# Patient Record
Sex: Female | Born: 1970 | Race: Black or African American | Hispanic: No | Smoking: Never smoker
Health system: Southern US, Community
[De-identification: ages and names within clinical notes are randomized; demographics above are authoritative.]

## PROBLEM LIST (undated history)

## (undated) DIAGNOSIS — A630 Anogenital (venereal) warts: Secondary | ICD-10-CM

## (undated) DIAGNOSIS — A6000 Herpesviral infection of urogenital system, unspecified: Secondary | ICD-10-CM

## (undated) DIAGNOSIS — R87619 Unspecified abnormal cytological findings in specimens from cervix uteri: Secondary | ICD-10-CM

## (undated) DIAGNOSIS — T7840XA Allergy, unspecified, initial encounter: Secondary | ICD-10-CM

## (undated) DIAGNOSIS — J45909 Unspecified asthma, uncomplicated: Secondary | ICD-10-CM

## (undated) DIAGNOSIS — E559 Vitamin D deficiency, unspecified: Secondary | ICD-10-CM

## (undated) DIAGNOSIS — Z803 Family history of malignant neoplasm of breast: Secondary | ICD-10-CM

## (undated) DIAGNOSIS — R319 Hematuria, unspecified: Secondary | ICD-10-CM

## (undated) DIAGNOSIS — Z8041 Family history of malignant neoplasm of ovary: Secondary | ICD-10-CM

## (undated) DIAGNOSIS — G5600 Carpal tunnel syndrome, unspecified upper limb: Secondary | ICD-10-CM

## (undated) DIAGNOSIS — G9050 Complex regional pain syndrome I, unspecified: Secondary | ICD-10-CM

## (undated) DIAGNOSIS — Z8051 Family history of malignant neoplasm of kidney: Secondary | ICD-10-CM

## (undated) HISTORY — DX: Herpesviral infection of urogenital system, unspecified: A60.00

## (undated) HISTORY — DX: Unspecified abnormal cytological findings in specimens from cervix uteri: R87.619

## (undated) HISTORY — DX: Family history of malignant neoplasm of breast: Z80.3

## (undated) HISTORY — DX: Allergy, unspecified, initial encounter: T78.40XA

## (undated) HISTORY — DX: Hematuria, unspecified: R31.9

## (undated) HISTORY — DX: Family history of malignant neoplasm of kidney: Z80.51

## (undated) HISTORY — DX: Unspecified asthma, uncomplicated: J45.909

## (undated) HISTORY — DX: Anogenital (venereal) warts: A63.0

## (undated) HISTORY — DX: Vitamin D deficiency, unspecified: E55.9

## (undated) HISTORY — DX: Family history of malignant neoplasm of ovary: Z80.41

## (undated) HISTORY — DX: Carpal tunnel syndrome, unspecified upper limb: G56.00

## (undated) HISTORY — DX: Complex regional pain syndrome I, unspecified: G90.50

---

## 2008-08-02 ENCOUNTER — Emergency Department: Payer: Self-pay | Admitting: Emergency Medicine

## 2008-08-19 DIAGNOSIS — R87619 Unspecified abnormal cytological findings in specimens from cervix uteri: Secondary | ICD-10-CM

## 2008-08-19 HISTORY — DX: Unspecified abnormal cytological findings in specimens from cervix uteri: R87.619

## 2008-08-19 HISTORY — PX: CERVICAL BIOPSY  W/ LOOP ELECTRODE EXCISION: SUR135

## 2009-04-20 ENCOUNTER — Ambulatory Visit: Payer: Self-pay

## 2009-04-27 ENCOUNTER — Ambulatory Visit: Payer: Self-pay

## 2010-03-05 ENCOUNTER — Other Ambulatory Visit: Payer: Self-pay

## 2010-12-06 ENCOUNTER — Ambulatory Visit: Payer: Self-pay

## 2011-07-18 ENCOUNTER — Ambulatory Visit: Payer: Self-pay

## 2013-02-24 ENCOUNTER — Ambulatory Visit: Payer: Self-pay | Admitting: Surgery

## 2013-10-01 ENCOUNTER — Ambulatory Visit: Payer: Self-pay | Admitting: Surgery

## 2014-11-09 ENCOUNTER — Ambulatory Visit: Payer: Self-pay | Admitting: Podiatry

## 2014-11-10 ENCOUNTER — Ambulatory Visit (INDEPENDENT_AMBULATORY_CARE_PROVIDER_SITE_OTHER): Payer: BLUE CROSS/BLUE SHIELD | Admitting: Podiatry

## 2014-11-10 ENCOUNTER — Ambulatory Visit (INDEPENDENT_AMBULATORY_CARE_PROVIDER_SITE_OTHER): Payer: BLUE CROSS/BLUE SHIELD

## 2014-11-10 ENCOUNTER — Encounter: Payer: Self-pay | Admitting: Podiatry

## 2014-11-10 VITALS — BP 129/89 | HR 88 | Resp 16 | Ht 65.0 in | Wt 122.0 lb

## 2014-11-10 DIAGNOSIS — M62838 Other muscle spasm: Secondary | ICD-10-CM

## 2014-11-10 DIAGNOSIS — M79672 Pain in left foot: Secondary | ICD-10-CM

## 2014-11-10 DIAGNOSIS — M2142 Flat foot [pes planus] (acquired), left foot: Secondary | ICD-10-CM

## 2014-11-10 DIAGNOSIS — M79671 Pain in right foot: Secondary | ICD-10-CM

## 2014-11-10 DIAGNOSIS — M2141 Flat foot [pes planus] (acquired), right foot: Secondary | ICD-10-CM

## 2014-11-10 NOTE — Progress Notes (Signed)
   Subjective:    Patient ID: Stacey Morris, female    DOB: 08-Oct-1970, 44 y.o.   MRN: [id number]  HPI 44 year old female presents the office today with complaints of bilateral leg numbness, toe curling and muscle spasms. She states that the symptoms are intermittent in nature and not continuous. She cannot identify and inside the incident which aggravates the symptoms. She describes though it her leg cramps her feet go up and out. She denies any history of injury or trauma. She intermittently has numbness to her feet mostly on the bottom at that time cramping. Currently she does not have any the symptoms. States the symptoms have been going on for greater than one year and have not be progressive. Denies any swelling or redness overlying the area. No other complaints at this time.    Review of Systems  Allergic/Immunologic: Positive for environmental allergies.  All other systems reviewed and are negative.      Objective:   Physical Exam AAO 3, NAD DP/PT pulses palpable, CRT less than 3 seconds Protective sensation intact with Simms Weinstein monofilament, vibratory sensation intact, Achilles tendon reflex intact. There is a decrease in medial arch height upon weightbearing with equinus present. There is no areas of pinpoint bony tenderness or pain the vibratory sensation of bilateral/ankle. There is no overlying edema, erythema, increase in warmth bilaterally. Ankle joint subtotal joint range of motion is intact and without restriction. MMT 5/5, ROM WNL No open lesions or pre-ulcerative lesions identified bilaterally. No pain with calf compression, swelling, warmth, erythema.      Assessment & Plan:  44 year old female with flatfoot deformity, possible peroneal spasm -X-rays were obtained and reviewed. -Treatment options were discussed included alternatives, risks, complications. -Discussed likely etiology of her symptoms. -At this time I believe she could benefit from orthotics to help  support her foot type to help prevent spasm. It appears that that her peroneals are spasming due to flatfoot. -If symptoms continue likely referred to neurology. -Follow-up in 4 weeks or sooner if any problems are to arise. In the meantime occurs to call the office with any questions, concerns, change in symptoms.

## 2015-01-30 DIAGNOSIS — M545 Low back pain, unspecified: Secondary | ICD-10-CM | POA: Insufficient documentation

## 2015-01-30 DIAGNOSIS — G8929 Other chronic pain: Secondary | ICD-10-CM | POA: Insufficient documentation

## 2015-08-30 ENCOUNTER — Telehealth: Payer: Self-pay | Admitting: *Deleted

## 2015-08-30 NOTE — Telephone Encounter (Signed)
Pt states she is having problems with the bottom of her feet again and wanted a prescription.  I reviewed pt's clinical record and she has not been seen in our office for over 9 months.  I left a message on the phone number left by the pt although there did not seem to be an introduction, but a silent spot for a message.

## 2017-10-23 ENCOUNTER — Ambulatory Visit (INDEPENDENT_AMBULATORY_CARE_PROVIDER_SITE_OTHER): Payer: BLUE CROSS/BLUE SHIELD | Admitting: Certified Nurse Midwife

## 2017-10-23 ENCOUNTER — Encounter: Payer: Self-pay | Admitting: Certified Nurse Midwife

## 2017-10-23 VITALS — BP 130/80 | HR 85 | Ht 65.0 in | Wt 126.0 lb

## 2017-10-23 DIAGNOSIS — Z01419 Encounter for gynecological examination (general) (routine) without abnormal findings: Secondary | ICD-10-CM

## 2017-10-23 DIAGNOSIS — Z1211 Encounter for screening for malignant neoplasm of colon: Secondary | ICD-10-CM

## 2017-10-23 DIAGNOSIS — A6000 Herpesviral infection of urogenital system, unspecified: Secondary | ICD-10-CM | POA: Insufficient documentation

## 2017-10-23 DIAGNOSIS — Z1231 Encounter for screening mammogram for malignant neoplasm of breast: Secondary | ICD-10-CM

## 2017-10-23 DIAGNOSIS — Z1239 Encounter for other screening for malignant neoplasm of breast: Secondary | ICD-10-CM

## 2017-10-23 DIAGNOSIS — T7840XA Allergy, unspecified, initial encounter: Secondary | ICD-10-CM | POA: Insufficient documentation

## 2017-10-23 DIAGNOSIS — Z124 Encounter for screening for malignant neoplasm of cervix: Secondary | ICD-10-CM | POA: Diagnosis not present

## 2017-10-23 DIAGNOSIS — Z3041 Encounter for surveillance of contraceptive pills: Secondary | ICD-10-CM

## 2017-10-23 DIAGNOSIS — J45909 Unspecified asthma, uncomplicated: Secondary | ICD-10-CM | POA: Insufficient documentation

## 2017-10-23 MED ORDER — VALACYCLOVIR HCL 1 G PO TABS: ORAL_TABLET | ORAL | 3 refills | Status: DC

## 2017-10-23 MED ORDER — TRI-SPRINTEC 0.18/0.215/0.25 MG-35 MCG PO TABS: 1.0000 | ORAL_TABLET | Freq: Every day | ORAL | 3 refills | Status: DC

## 2017-10-23 NOTE — Progress Notes (Signed)
valtrex     Gynecology Annual Exam  PCP: Patient, No Pcp Per  Chief Complaint:  Chief Complaint  Patient presents with  . Gynecologic Exam    History of Present Illness:Stacey Morris is a 47 year old African American/Black female , G 4 P 3 0 1 3 , who presents for her annual exam . She is having no significant GYN problems. She just moved back from [facility]. Her menses are regular and her LMP was 09/17/2017. They occur every 1 month, they last 4 days, are medium flow (occasionally heavier flow requiring pad and tampon change 4-5x/day)., and are without clots.  She has had no intermenstrual spotting.  She reports worsening  dysmenorrhea requiring Aleve +/- heating pad  The patient's past medical history is remarkable for allergies and asthma. She has a history of HSV, but has had only 2 outbreaks since being diagnosed in 2012.  She had her last annual and Pap smear in 2017 in [place].  She is sexually active. She is currently using birth control pills for contraception. Her current partner is HSV IGG negative. Have been using condoms also. Is interested in antiviral PPX to reduce risk of transmission. Her most recent pap smear was obtained 2017 and was negative per patient report. Her most recent mammogram obtained 2017 and was stable. She had been referred to DR Pat Patrick for evaluation of small right breast masses 2014 by Dr Laurey Morale.  There is a positive history of breast cancer in her paternal aunt. Genetic testing has not been done.  There is no family history of ovarian cancer.  The patient does do monthly self breast exams.  The patient does not smoke.  The patient does occasionally drink alcohol.  The patient does not use illegal drugs.  The patient exercises occasionally.  The patient does get adequate calcium in her diet.  She has had a recent cholesterol screen in 2017 and it was normal per patient report.   The patient denies current symptoms of depression.    Review  of Systems: Review of Systems  Constitutional: Positive for malaise/fatigue. Negative for chills, fever and weight loss.  HENT: Positive for congestion and sore throat. Negative for sinus pain.        Positive for sneezing  Eyes: Positive for redness. Negative for blurred vision and pain.  Respiratory: Positive for cough, shortness of breath and wheezing. Negative for hemoptysis.   Cardiovascular: Negative for chest pain, palpitations and leg swelling.       Difficulty breathing when lying down R./T allergies  Gastrointestinal: Negative for abdominal pain, blood in stool, diarrhea, heartburn, nausea and vomiting.  Genitourinary: Negative for dysuria, frequency, hematuria and urgency.  Musculoskeletal: Negative for back pain, joint pain and myalgias.  Skin: Negative for itching and rash.  Neurological: Positive for tingling (feet (plantar fasciitis)) and headaches. Negative for dizziness.  Endo/Heme/Allergies: Positive for environmental allergies. Negative for polydipsia. Does not bruise/bleed easily.       Negative for hirsutism; positive for occasional hot flashes   Psychiatric/Behavioral: Negative for depression. The patient is not nervous/anxious and does not have insomnia.     Past Medical History:  Past Medical History:  Diagnosis Date  . Allergy   . Asthma   . Breast mass 2017   monitor with mammograms  . Genital herpes   . Genital warts     Past Surgical History:  Past Surgical History:  Procedure Laterality Date  . CERVICAL BIOPSY  W/ LOOP ELECTRODE EXCISION  2010  LGSIL benign pathology    Family History:  Family History  Problem Relation Age of Onset  . Liver cancer Mother 97  . Diabetes Father   . Breast cancer Paternal Aunt 19    Social History:  Social History   Socioeconomic History  . Marital status: Married    Spouse name: Not on file  . Number of children: Not on file  . Years of education: Not on file  . Highest education level: Not on file    Social Needs  . Financial resource strain: Not on file  . Food insecurity - worry: Not on file  . Food insecurity - inability: Not on file  . Transportation needs - medical: Not on file  . Transportation needs - non-medical: Not on file  Occupational History  . Not on file  Tobacco Use  . Smoking status: Never Smoker  . Smokeless tobacco: Never Used  Substance and Sexual Activity  . Alcohol use: Yes    Alcohol/week: 0.0 oz    Comment: occasional  . Drug use: No  . Sexual activity: Yes    Birth control/protection: Pill  Other Topics Concern  . Not on file  Social History Narrative  . Not on file    Allergies:  Allergies  Allergen Reactions  . Prednisone Nausea And Vomiting    Medications: Prior to Admission medications   Medication Sig Start Date End Date Taking? Authorizing Provider  Levonorgestrel-Ethinyl Estradiol (AMETHIA,CAMRESE) 0.15-0.03 &0.01 MG tablet Take 1 tablet by mouth daily.    [provider]    Physical Exam Vitals: BP 130/80   Pulse 85   Ht 5\' 5"  (1.651 m)   Wt 126 lb (57.2 kg)   LMP 09/17/2017 (Exact Date)   BMI 20.97 kg/m  General: BF in NAD HEENT: normocephalic, anicteric Neck: no thyroid enlargement, no palpable nodules, no cervical lymphadenopathy  Pulmonary: No increased work of breathing, wheezing in LLL. CTA in right lung. Cardiovascular: RRR, without murmur  Breast: Breast symmetrical, no tenderness, no palpable nodules or masses, no skin or nipple retraction present, no nipple discharge.  No axillary, infraclavicular or supraclavicular lymphadenopathy. Abdomen: Soft, non-tender, non-distended.  Umbilicus without lesions.  No hepatomegaly or masses palpable. No evidence of hernia. Genitourinary:  External: Normal external female genitalia.  Normal urethral meatus, normal Bartholin's and Skene's glands.    Vagina: Normal vaginal mucosa, no evidence of prolapse.    Cervix: Grossly normal in appearance, anterior, no bleeding,  non-tender  Uterus: Retroflexed, normal size, shape, and consistency, mobile, and non-tender  Adnexa: No adnexal masses, non-tender  Rectal: deferred  Lymphatic: no evidence of inguinal lymphadenopathy Extremities: no edema, erythema, or tenderness Neurologic: Grossly intact Psychiatric: mood appropriate, affect full     Assessment: 47 y.o. annual gyn exam Hx of HSV II  Plan:   1) Breast cancer screening - recommend monthly self breast exam and annual screening mammograms. Mammogram was ordered today.  2) Valtrex 1 GM- take 0.5 tab daily   3) Cervical cancer screening - Pap was done. ASCCP guidelines and rational discussed.  Patient opts for yearly screening interval  4) Contraception - Refills of Trisprintec sent to pharmacy #3packs with 3refills  5) Routine healthcare maintenance including cholesterol and diabetes screening UTD   6) Colon cancer screening-options discussed-desires FOBT home collection  7) RTO 1 year and prn  Dalia Heading, CNM

## 2017-10-24 ENCOUNTER — Telehealth: Payer: Self-pay | Admitting: Certified Nurse Midwife

## 2017-10-24 NOTE — Telephone Encounter (Signed)
Pt called to advise it was ok to send pap through as she was able to verify [place] will cover it

## 2017-10-24 NOTE — Telephone Encounter (Signed)
Pap processed for [facility]

## 2017-10-30 LAB — IGP, APTIMA HPV
HPV APTIMA: NEGATIVE
PAP Smear Comment: 0

## 2018-01-08 ENCOUNTER — Encounter: Payer: Self-pay | Admitting: Obstetrics and Gynecology

## 2018-01-08 ENCOUNTER — Ambulatory Visit (INDEPENDENT_AMBULATORY_CARE_PROVIDER_SITE_OTHER): Payer: BLUE CROSS/BLUE SHIELD

## 2018-01-08 ENCOUNTER — Ambulatory Visit
Admission: RE | Admit: 2018-01-08 | Discharge: 2018-01-08 | Disposition: A | Discharge status: Home / Self Care | Payer: BLUE CROSS/BLUE SHIELD | Source: Ambulatory Visit | Attending: Certified Nurse Midwife | Admitting: Certified Nurse Midwife

## 2018-01-08 ENCOUNTER — Ambulatory Visit: Payer: BLUE CROSS/BLUE SHIELD | Admitting: Obstetrics and Gynecology

## 2018-01-08 VITALS — BP 120/80 | Ht 65.0 in | Wt 125.0 lb

## 2018-01-08 DIAGNOSIS — R102 Pelvic and perineal pain: Secondary | ICD-10-CM

## 2018-01-08 DIAGNOSIS — N3000 Acute cystitis without hematuria: Secondary | ICD-10-CM

## 2018-01-08 DIAGNOSIS — Z1239 Encounter for other screening for malignant neoplasm of breast: Secondary | ICD-10-CM

## 2018-01-08 DIAGNOSIS — Z1231 Encounter for screening mammogram for malignant neoplasm of breast: Secondary | ICD-10-CM | POA: Diagnosis present

## 2018-01-08 LAB — POCT URINALYSIS DIPSTICK
Bilirubin, UA: NEGATIVE
Glucose, UA: NEGATIVE
Nitrite, UA: NEGATIVE
PH UA: 8 (ref 5.0–8.0)
Protein, UA: POSITIVE — AB
RBC UA: NEGATIVE
Spec Grav, UA: 1.01 (ref 1.010–1.025)

## 2018-01-08 MED ORDER — CIPROFLOXACIN 500 MG/5ML (10%) PO SUSR: 500.0000 mg | Freq: Two times a day (BID) | ORAL | 0 refills | Status: AC

## 2018-01-08 NOTE — Progress Notes (Signed)
Patient, No Pcp Per   Chief Complaint  Patient presents with  . Abdominal Pain    x 1 week   . Back Pain    HPI:      [name] is a 47 y.o. [phone] who LMP was Patient's last menstrual period was 11/25/2017., presents today for abd and low back pain for the past 1 1/2 wks. Sx started randomly. Abd pain is constant and achy with bloating. Intensity is mild to moderate. Aleve/lying down can help a little bit. No relation to BM. No aggrav factors. No GI, urin, vag sx. Has urin frequency with good flow. No fevers. Has had mid LBP that feels like UTI yrs ago. Pt is sex active, no new partners. No vag bleeding. On OCPs. Had annual 3/19 with neg pap.    Past Medical History:  Diagnosis Date  . Abnormal Pap smear of cervix 2010   LGSIL with negative path on LEEP  . Allergy   . Asthma   . Genital herpes   . Genital warts     Past Surgical History:  Procedure Laterality Date  . CERVICAL BIOPSY  W/ LOOP ELECTRODE EXCISION  2010   LGSIL benign pathology    Family History  Problem Relation Age of Onset  . Liver cancer Mother 41  . Diabetes Father   . Breast cancer Paternal Aunt 61  . Sarcoidosis Sister     Social History   Socioeconomic History  . Marital status: Divorced    Spouse name: Not on file  . Number of children: 3  . Years of education: Not on file  . Highest education level: Not on file  Occupational History  . Occupation: Glass blower/designer  Social Needs  . Financial resource strain: Not on file  . Food insecurity:    Worry: Not on file    Inability: Not on file  . Transportation needs:    Medical: Not on file    Non-medical: Not on file  Tobacco Use  . Smoking status: Never Smoker  . Smokeless tobacco: Never Used  Substance and Sexual Activity  . Alcohol use: Yes    Alcohol/week: 0.0 oz    Comment: occasional  . Drug use: No  . Sexual activity: Yes    Partners: Male    Birth control/protection: Pill  Lifestyle  . Physical activity:   Days per week: 0 days    Minutes per session: 0 min  . Stress: Rather much  Relationships  . Social connections:    Talks on phone: Twice a week    Gets together: Once a week    Attends religious service: Never    Active member of club or organization: No    Attends meetings of clubs or organizations: Never    Relationship status: Living with partner  . Intimate partner violence:    Fear of current or ex partner: No    Emotionally abused: No    Physically abused: No    Forced sexual activity: No  Other Topics Concern  . Not on file  Social History Narrative  . Not on file    Outpatient Medications Prior to Visit  Medication Sig Dispense Refill  . TRI-SPRINTEC 0.18/0.215/0.25 MG-35 MCG tablet Take 1 tablet by mouth daily. 3 Package 3  . ALBUTEROL SULFATE HFA IN Inhale into the lungs.    . diphenhydrAMINE (BENADRYL) 25 MG tablet Take 25 mg by mouth as needed.    . fluticasone furoate-vilanterol (BREO ELLIPTA) 100-25 MCG/INH  AEPB Inhale 1 puff into the lungs daily.    Marland Kitchen levocetirizine (XYZAL) 5 MG tablet   4  . valACYclovir (VALTREX) 1000 MG tablet Take 0.5 tablets daily 45 tablet 3   No facility-administered medications prior to visit.     ROS:  Review of Systems  Constitutional: Positive for fatigue. Negative for fever.  Gastrointestinal: Positive for abdominal pain. Negative for blood in stool, constipation, diarrhea, nausea and vomiting.  Genitourinary: Positive for frequency. Negative for dyspareunia, dysuria, flank pain, hematuria, urgency, vaginal bleeding, vaginal discharge and vaginal pain.  Musculoskeletal: Positive for back pain.  Skin: Negative for rash.    OBJECTIVE:   Vitals:  BP 120/80   Ht 5\' 5"  (1.651 m)   Wt 125 lb (56.7 kg)   LMP 11/25/2017   BMI 20.80 kg/m   Physical Exam  Constitutional: She is oriented to person, place, and time. Vital signs are normal. She appears well-developed.  Pulmonary/Chest: Effort normal.  Abdominal: Soft. There is  generalized tenderness. There is no rigidity and no guarding.  Genitourinary: Vagina normal. There is no rash, tenderness or lesion on the right labia. There is no rash, tenderness or lesion on the left labia. Uterus is tender. Uterus is not enlarged. Cervix exhibits no motion tenderness. Right adnexum displays tenderness. Right adnexum displays no mass. Left adnexum displays tenderness. Left adnexum displays no mass. No erythema or tenderness in the vagina. No vaginal discharge found.  Musculoskeletal: Normal range of motion.       Lumbar back: She exhibits tenderness. She exhibits no bony tenderness and no spasm.  CVAT BILAT; NT TO PALPATE MUSCLES  Neurological: She is alert and oriented to person, place, and time.  Psychiatric: She has a normal mood and affect. Her behavior is normal. Thought content normal.  Vitals reviewed.   Results: Results for orders placed or performed in visit on 01/08/18 (from the past 24 hour(s))  POCT Urinalysis Dipstick     Status: Abnormal   Collection Time: 01/08/18  3:11 PM  Result Value Ref Range   Color, UA yellow    Clarity, UA clear    Glucose, UA Negative Negative   Bilirubin, UA neg    Ketones, UA trace    Spec Grav, UA 1.010 1.010 - 1.025   Blood, UA neg    pH, UA 8.0 5.0 - 8.0   Protein, UA Positive (A) Negative   Urobilinogen, UA  0.2 or 1.0 E.U./dL   Nitrite, UA neg    Leukocytes, UA Small (1+) (A) Negative   Appearance     Odor     Patient Name: Stacey Morris DOB: 11-19-1970 MRN: [id number]  ULTRASOUND REPORT  Location: [facility]  Date of Service: 01/08/2018    Indications:Pelvic Pain Findings:  The uterus is retroverted and measures 9.09x6.35x4.84cm. Echo texture is heterogenous with evidence of focal masses. Within the uterus are multiple suspected fibroids measuring: Fibroid 1:SS, Anterior ut wall , = 16.4x12.86mm Fibroid 2:IM, Anterior Ut wall = 21.61x71mm Fibroid 3: IM , Rt Ut wall , Highly vascular (active)  =  35.51x27.73mm The Endometrium measures 5.08 mm.  Right Ovary measures not seen  Left Ovary measures 4.3x2.15x2.12 cm. It is normal in appearance. Survey of the adnexa demonstrates no adnexal masses. There is  free fluid in the anterior Rt cul de sac.arround fibroid # 3 = 3.04x2.23cm  Impression: 1. Within the uterus are multiple suspected fibroids measuring: Fibroid 1:SS, Anterior ut wall , = 16.4x12.32mm Fibroid 2:IM, Anterior Ut wall = 21.61x39mm Fibroid  3: IM , Rt Ut wall , Highly vascular (active)  = 35.51x27.20mm 2. free fluid in the anterior Rt cul de sac.arround fibroid # 3 = 3.04x2.23cm   Recommendations: 1.Clinical correlation with the patient's History and Physical Exam. 2.  Abeer Alsammarraie RDMS   Assessment/Plan: Pelvic pain - Essentially neg dip but pos CVAT bilat. GYN u/s with 3 small leio/FF around leio. Treat for UTI empirically wiht cipro (pt needs liquid). Will f/u wiht results. - Plan: Urine Culture, POCT Urinalysis Dipstick, US PELVIS TRANSVANGINAL NON-OB (TV ONLY)  Acute cystitis without hematuria - Question dx but treat given u/s results and exam. Rx cipro. F/u prn sx.  - Plan: ciprofloxacin (CIPRO) 500 MG/5ML (10%) suspension    Meds ordered this encounter  Medications  . ciprofloxacin (CIPRO) 500 MG/5ML (10%) suspension    Sig: Take 5 mLs (500 mg total) by mouth 2 (two) times daily for 5 days.    Dispense:  50 mL    Refill:  0    Order Specific Question:   Supervising Provider    Answer:   Gae Dry [[id number]]      Return if symptoms worsen or fail to improve.  Kaidan Spengler B. Kyshon Tolliver, PA-C 01/08/2018 5:41 PM

## 2018-01-08 NOTE — Patient Instructions (Signed)
I value your feedback and entrusting us with your care. If you get a [facility] patient survey, I would appreciate you taking the time to let us know about your experience today. Thank you! 

## 2018-01-09 ENCOUNTER — Telehealth: Payer: Self-pay

## 2018-01-09 ENCOUNTER — Other Ambulatory Visit: Payer: Self-pay | Admitting: Advanced Practice Midwife

## 2018-01-09 DIAGNOSIS — R1030 Lower abdominal pain, unspecified: Secondary | ICD-10-CM

## 2018-01-09 MED ORDER — CEPHALEXIN 125 MG/5ML PO SUSR: 250.0000 mg | Freq: Two times a day (BID) | ORAL | 0 refills | Status: DC

## 2018-01-09 NOTE — Progress Notes (Signed)
Pharmacy does not have Cipro suspension. Rx sent for Keflex suspension.

## 2018-01-09 NOTE — Telephone Encounter (Signed)
Spoke with patient and sent Rx for Keflex suspension to her pharmacy.

## 2018-01-09 NOTE — Telephone Encounter (Signed)
Pt calling triage, states she was seen yesterday by ABC. She sent in liquid antibiotic. The pharmacy does not have that in stock, she also checked with a different pharm. Is there something else that is similar that can be sent in? Can you see if JEG can look at this since ABC not in office and I am not sure when she would be able to look at this? Thank you.

## 2018-01-10 LAB — URINE CULTURE

## 2018-01-14 NOTE — Progress Notes (Signed)
Does FF around leio mean anything?

## 2018-02-05 ENCOUNTER — Telehealth: Payer: Self-pay

## 2018-02-05 NOTE — Telephone Encounter (Signed)
Pt calling for mammogram results as she normally has to have two per year.  No one has called her with results.  [phone]

## 2018-02-26 DIAGNOSIS — M5416 Radiculopathy, lumbar region: Secondary | ICD-10-CM | POA: Insufficient documentation

## 2018-02-26 DIAGNOSIS — J302 Other seasonal allergic rhinitis: Secondary | ICD-10-CM | POA: Insufficient documentation

## 2018-02-26 DIAGNOSIS — M5137 Other intervertebral disc degeneration, lumbosacral region: Secondary | ICD-10-CM | POA: Insufficient documentation

## 2018-02-26 DIAGNOSIS — J4540 Moderate persistent asthma, uncomplicated: Secondary | ICD-10-CM | POA: Insufficient documentation

## 2018-04-17 ENCOUNTER — Other Ambulatory Visit: Payer: Self-pay | Admitting: Physical Medicine and Rehabilitation

## 2018-04-17 DIAGNOSIS — M5416 Radiculopathy, lumbar region: Secondary | ICD-10-CM

## 2018-05-04 ENCOUNTER — Ambulatory Visit: Payer: BLUE CROSS/BLUE SHIELD

## 2018-08-19 DIAGNOSIS — G629 Polyneuropathy, unspecified: Secondary | ICD-10-CM

## 2018-08-19 HISTORY — DX: Polyneuropathy, unspecified: G62.9

## 2018-09-21 ENCOUNTER — Other Ambulatory Visit: Payer: Self-pay | Admitting: Internal Medicine

## 2018-09-21 DIAGNOSIS — R3121 Asymptomatic microscopic hematuria: Principal | ICD-10-CM

## 2018-09-23 ENCOUNTER — Ambulatory Visit: Payer: Self-pay | Admitting: Urology

## 2018-10-16 ENCOUNTER — Other Ambulatory Visit: Payer: Self-pay | Admitting: Certified Nurse Midwife

## 2018-11-08 ENCOUNTER — Other Ambulatory Visit: Payer: Self-pay | Admitting: Certified Nurse Midwife

## 2018-11-11 ENCOUNTER — Telehealth: Payer: Self-pay

## 2018-11-11 NOTE — Telephone Encounter (Signed)
Pt needs BC RF, annual is scheduled for 12/11/18.

## 2018-11-12 NOTE — Telephone Encounter (Signed)
Pt was sent in [place] c 2ref 11/08/18.

## 2018-12-11 ENCOUNTER — Ambulatory Visit: Payer: BLUE CROSS/BLUE SHIELD | Admitting: Certified Nurse Midwife

## 2019-01-15 ENCOUNTER — Other Ambulatory Visit: Payer: Self-pay

## 2019-01-15 MED ORDER — NORGESTIM-ETH ESTRAD TRIPHASIC 0.18/0.215/0.25 MG-35 MCG PO TABS: 1.0000 | ORAL_TABLET | Freq: Every day | ORAL | 1 refills | Status: DC

## 2019-01-19 ENCOUNTER — Ambulatory Visit: Payer: BLUE CROSS/BLUE SHIELD | Admitting: Certified Nurse Midwife

## 2019-01-30 ENCOUNTER — Other Ambulatory Visit: Payer: Self-pay | Admitting: Certified Nurse Midwife

## 2019-02-25 ENCOUNTER — Other Ambulatory Visit: Payer: Self-pay

## 2019-02-25 ENCOUNTER — Ambulatory Visit (INDEPENDENT_AMBULATORY_CARE_PROVIDER_SITE_OTHER): Payer: BLUE CROSS/BLUE SHIELD | Admitting: Certified Nurse Midwife

## 2019-02-25 ENCOUNTER — Encounter: Payer: Self-pay | Admitting: Certified Nurse Midwife

## 2019-02-25 VITALS — BP 110/70 | Ht 65.0 in | Wt 143.0 lb

## 2019-02-25 DIAGNOSIS — Z01419 Encounter for gynecological examination (general) (routine) without abnormal findings: Secondary | ICD-10-CM

## 2019-02-25 DIAGNOSIS — Z1239 Encounter for other screening for malignant neoplasm of breast: Secondary | ICD-10-CM

## 2019-02-25 DIAGNOSIS — Z124 Encounter for screening for malignant neoplasm of cervix: Secondary | ICD-10-CM

## 2019-02-25 DIAGNOSIS — R7989 Other specified abnormal findings of blood chemistry: Secondary | ICD-10-CM | POA: Diagnosis not present

## 2019-02-25 DIAGNOSIS — Z1211 Encounter for screening for malignant neoplasm of colon: Secondary | ICD-10-CM

## 2019-02-25 DIAGNOSIS — Z0000 Encounter for general adult medical examination without abnormal findings: Secondary | ICD-10-CM

## 2019-02-25 DIAGNOSIS — D252 Subserosal leiomyoma of uterus: Secondary | ICD-10-CM

## 2019-02-25 DIAGNOSIS — D251 Intramural leiomyoma of uterus: Secondary | ICD-10-CM | POA: Insufficient documentation

## 2019-02-25 LAB — HM PAP SMEAR: HM Pap smear: NORMAL

## 2019-02-25 MED ORDER — NORETHIN ACE-ETH ESTRAD-FE 1-20 MG-MCG PO TABS: ORAL_TABLET | ORAL | 4 refills | Status: DC

## 2019-02-25 NOTE — Progress Notes (Signed)
valtrex     Gynecology Annual Exam  PCP: Patient, No Pcp Per  Chief Complaint:  Chief Complaint  Patient presents with  . Gynecologic Exam    History of Present Illness:Stacey Morris is a 48 year old African American/Black female , G 4 P 3 0 1 3 , who presents for her annual exam . She is having problems with heavier and more painful menses since the end of 2019. She is using a triphasic pill-taking it continuously x 3 packs before taking the placebo pills.She has uterine fibroids. Had an ultrasound last year revealing SS and IM fibroids, the largest measured was 3.5 x 2.7 cm. Her menses are regular and her LMP was 01/11/2019. They occur every 3 months, they last 7 days, with 5 heavier days,  requiring pad changes every 3 hours.  She has had no intermenstrual spotting.  She reports worsening  dysmenorrhea requiring Aleve +/- heating pad  The patient's past medical history is remarkable for allergies and asthma. She has a history of HSV, but has had only 2 outbreaks since being diagnosed in 2012.  Since her last annual exam 10/23/2017, she had a work up for microscopic hematuria (negative CT), and has DDD and carpel tunnel on the right arm/hand. She married Thurmond Butts last 2023/03/19 and he died of pancreatic cancer this year. She was started on Remeron for depression following his death.  She is not currently sexually active. She stopped taking Valtrex with the death of her partner. Her most recent pap smear was obtained 10/23/2017 and was NIL/negative HRHPV. Her most recent mammogram obtained 01/08/2018 and was negative.  There is a positive history of breast cancer in her paternal aunt. Genetic testing has not been done.  There is a family history of ovarian cancer in her maternal aunt. Genetic testing has not been done..  The patient does do monthly self breast exams.  The patient does not smoke.  The patient does occasionally drink alcohol.  The patient does not use illegal drugs.  The patient  exercises occasionally.  The patient does get adequate calcium in her diet.  She has had a recent cholesterol screen in 2019 and it was borderline (TC 211, HDL 72, LDL 111)   Review of Systems: Review of Systems  Constitutional: Positive for malaise/fatigue. Negative for chills, fever and weight loss.  HENT: Positive for congestion and sore throat. Negative for sinus pain.        Positive for sneezing  Eyes: Positive for blurred vision. Negative for pain and redness.  Respiratory: Positive for shortness of breath (asthma). Negative for cough, hemoptysis and wheezing.   Cardiovascular: Negative for chest pain, palpitations and leg swelling.       Difficulty breathing when lying down R./T allergies  Gastrointestinal: Negative for abdominal pain, blood in stool, diarrhea, heartburn, nausea and vomiting.  Genitourinary: Negative for dysuria, frequency, hematuria and urgency.  Musculoskeletal: Positive for joint pain. Negative for back pain and myalgias.  Skin: Negative for itching and rash.  Neurological: Positive for tingling (in right hand) and weakness. Negative for dizziness and headaches.  Endo/Heme/Allergies: Positive for environmental allergies. Negative for polydipsia. Does not bruise/bleed easily.       Negative for hirsutism; positive for occasional hot flashes and heat intolerance   Psychiatric/Behavioral: Positive for depression (situational/ spouse died). The patient is not nervous/anxious and does not have insomnia.     Past Medical History:  Past Medical History:  Diagnosis Date  . Abnormal Pap smear of cervix 2010  LGSIL with negative path on LEEP  . Allergy   . Asthma   . Genital herpes   . Genital warts     Past Surgical History:  Past Surgical History:  Procedure Laterality Date  . CERVICAL BIOPSY  W/ LOOP ELECTRODE EXCISION  2010   LGSIL benign pathology    Family History:  Family History  Problem Relation Age of Onset  . Liver cancer Mother 66  .  Diabetes Father   . Breast cancer Paternal Aunt 4  . Sarcoidosis Sister   . Ovarian cancer Maternal Aunt     Social History:  Social History   Socioeconomic History  . Marital status: Widowed    Spouse name: Not on file  . Number of children: 3  . Years of education: Not on file  . Highest education level: Not on file  Occupational History  . Occupation: Glass blower/designer  Social Needs  . Financial resource strain: Not on file  . Food insecurity    Worry: Not on file    Inability: Not on file  . Transportation needs    Medical: Not on file    Non-medical: Not on file  Tobacco Use  . Smoking status: Never Smoker  . Smokeless tobacco: Never Used  Substance and Sexual Activity  . Alcohol use: Yes    Alcohol/week: 0.0 standard drinks    Comment: occasional  . Drug use: No  . Sexual activity: Yes    Partners: Male    Birth control/protection: Pill  Lifestyle  . Physical activity    Days per week: 0 days    Minutes per session: 0 min  . Stress: Rather much  Relationships  . Social Herbalist on phone: Twice a week    Gets together: Once a week    Attends religious service: Never    Active member of club or organization: No    Attends meetings of clubs or organizations: Never    Relationship status: Living with partner  . Intimate partner violence    Fear of current or ex partner: No    Emotionally abused: No    Physically abused: No    Forced sexual activity: No  Other Topics Concern  . Not on file  Social History Narrative  . Not on file    Allergies:  Allergies  Allergen Reactions  . Prednisone Nausea And Vomiting    Medications: Current Outpatient Medications on File Prior to Visit  Medication Sig Dispense Refill  . ALBUTEROL SULFATE HFA IN Inhale into the lungs.    . diclofenac (VOLTAREN) 75 MG EC tablet Take 75 mg by mouth 2 (two) times daily.    . diphenhydrAMINE (BENADRYL) 25 MG tablet Take 25 mg by mouth as needed.    . fluticasone  furoate-vilanterol (BREO ELLIPTA) 100-25 MCG/INH AEPB Inhale 1 puff into the lungs daily.    Marland Kitchen levocetirizine (XYZAL) 5 MG tablet   4  . mirtazapine (REMERON) 15 MG tablet     . tiZANidine (ZANAFLEX) 2 MG tablet TAKE 1-2 TABLETS BY MOUTH THREE TIMES A DAY AS NEEDED FOR MUSCLE SPASM    . zolpidem (AMBIEN) 5 MG tablet Take by mouth.     No current facility-administered medications on file prior to visit.    Triprevifem  Physical Exam Vitals: BP 110/70   Ht 5\' 5"  (1.651 m)   Wt 143 lb (64.9 kg)   LMP 01/11/2019   BMI 23.80 kg/m  General: BF in NAD HEENT: normocephalic,  anicteric Neck: no thyroid enlargement, no palpable nodules, no cervical lymphadenopathy  Pulmonary: No increased work of breathing/ CTAB Cardiovascular: RRR, without murmur  Breast: Breast symmetrical, no tenderness, no palpable nodules or masses, no skin or nipple retraction present, no nipple discharge.  No axillary, infraclavicular or supraclavicular lymphadenopathy. Abdomen: Soft, non-tender, non-distended.  Umbilicus without lesions.  No hepatomegaly or masses palpable. No evidence of hernia. Genitourinary:  External: Normal external female genitalia.  Normal urethral meatus, normal Bartholin's and Skene's glands.    Vagina: Normal vaginal mucosa, no evidence of prolapse.    Cervix: Grossly normal in appearance, anterior, no bleeding, non-tender  Uterus: Retroflexed, irregular and enlarged posteriorly, immobile, and non-tender  Adnexa: No adnexal masses, non-tender  Rectal: deferred  Lymphatic: no evidence of inguinal lymphadenopathy Extremities: no edema, erythema, or tenderness in lower extremities; Wrist brace on right upper extremity Neurologic: Grossly intact Psychiatric: mood appropriate, affect full     Assessment: 48 y.o. annual gyn exam Uterine fibroids Heavier, more painful menses possibly due to fibroids   Plan:   1) Breast cancer screening - recommend monthly self breast exam and annual  screening mammograms. Mammogram was ordered today.  2) Change pills to Loestrin 1/20 and take continuously x 3 packs before pausing for menses. If menses are persistently heavy or painful, return to office  3) Cervical cancer screening - Pap was done. ASCCP guidelines and rational discussed.  Patient opts for yearly screening interval  4 Routine healthcare maintenance including cholesterol and diabetes screening UTD   5) Colon cancer screening-options discussed-desires cologuard. Order sent  6) RTO 1 year and prn  Dalia Heading, CNM

## 2019-03-05 LAB — IGP,RFX APTIMA HPV ALL PTH

## 2019-03-08 ENCOUNTER — Other Ambulatory Visit: Payer: Self-pay | Admitting: Certified Nurse Midwife

## 2019-03-10 ENCOUNTER — Other Ambulatory Visit: Payer: Self-pay | Admitting: Certified Nurse Midwife

## 2019-03-13 ENCOUNTER — Encounter: Payer: Self-pay | Admitting: Obstetrics and Gynecology

## 2019-03-24 ENCOUNTER — Other Ambulatory Visit: Payer: Self-pay

## 2019-03-24 DIAGNOSIS — Z20822 Contact with and (suspected) exposure to covid-19: Secondary | ICD-10-CM

## 2019-03-25 ENCOUNTER — Telehealth: Payer: Self-pay

## 2019-03-25 LAB — NOVEL CORONAVIRUS, NAA: SARS-CoV-2, NAA: NOT DETECTED

## 2019-03-25 NOTE — Telephone Encounter (Signed)
Pt states she did not receive her Cologard after she saw CG. Please advise.. I have had a couple of these lately. Not sure if they have it on hold or anything d/t COVID.

## 2019-03-26 NOTE — Addendum Note (Signed)
Addended by: Dalia Heading on: 03/26/2019 09:10 AM   Modules accepted: Orders

## 2019-03-26 NOTE — Telephone Encounter (Signed)
Pt aware thru her MyChart msg that I will refax order.

## 2019-04-06 ENCOUNTER — Telehealth: Payer: Self-pay

## 2019-04-06 NOTE — Telephone Encounter (Signed)
Pt is telling CLG that her ins is not paying for preventative office visit.  Will you check on how it was coded please per CLG.  Clg said as far as she can tell she coded it right.

## 2019-04-06 NOTE — Telephone Encounter (Signed)
February 25, 2019

## 2019-04-13 NOTE — Telephone Encounter (Signed)
Forwarded to billing team.

## 2019-05-17 ENCOUNTER — Other Ambulatory Visit: Payer: Self-pay | Admitting: Certified Nurse Midwife

## 2019-05-17 NOTE — Telephone Encounter (Signed)
DId talk to Denyse Dago regarding this bill and will check on coding. Dalia Heading, CNM

## 2019-05-25 DIAGNOSIS — F3341 Major depressive disorder, recurrent, in partial remission: Secondary | ICD-10-CM | POA: Insufficient documentation

## 2019-05-25 DIAGNOSIS — R7989 Other specified abnormal findings of blood chemistry: Secondary | ICD-10-CM | POA: Insufficient documentation

## 2019-05-25 DIAGNOSIS — G4700 Insomnia, unspecified: Secondary | ICD-10-CM | POA: Insufficient documentation

## 2019-05-28 ENCOUNTER — Encounter: Payer: Self-pay | Admitting: Certified Nurse Midwife

## 2019-05-28 DIAGNOSIS — E559 Vitamin D deficiency, unspecified: Secondary | ICD-10-CM | POA: Insufficient documentation

## 2019-06-21 DIAGNOSIS — J4540 Moderate persistent asthma, uncomplicated: Secondary | ICD-10-CM | POA: Insufficient documentation

## 2019-08-11 ENCOUNTER — Ambulatory Visit: Payer: BLUE CROSS/BLUE SHIELD | Attending: Internal Medicine

## 2019-08-11 DIAGNOSIS — Z20822 Contact with and (suspected) exposure to covid-19: Secondary | ICD-10-CM

## 2019-08-12 LAB — NOVEL CORONAVIRUS, NAA: SARS-CoV-2, NAA: NOT DETECTED

## 2019-09-20 DIAGNOSIS — Z8051 Family history of malignant neoplasm of kidney: Secondary | ICD-10-CM | POA: Insufficient documentation

## 2019-10-03 ENCOUNTER — Other Ambulatory Visit: Payer: Self-pay | Admitting: Certified Nurse Midwife

## 2020-01-27 ENCOUNTER — Other Ambulatory Visit: Payer: Self-pay | Admitting: Certified Nurse Midwife

## 2020-01-27 MED ORDER — NORETHIN ACE-ETH ESTRAD-FE 1-20 MG-MCG PO TABS: ORAL_TABLET | ORAL | 1 refills | Status: DC

## 2020-01-31 ENCOUNTER — Other Ambulatory Visit: Payer: Self-pay | Admitting: Certified Nurse Midwife

## 2020-01-31 MED ORDER — LIDOCAINE-PRILOCAINE 2.5-2.5 % EX CREA: 1.0000 "application " | TOPICAL_CREAM | CUTANEOUS | 0 refills | Status: DC | PRN

## 2020-03-25 LAB — COLOGUARD

## 2020-04-15 ENCOUNTER — Other Ambulatory Visit: Payer: Self-pay | Admitting: Certified Nurse Midwife

## 2020-04-19 DIAGNOSIS — Z9189 Other specified personal risk factors, not elsewhere classified: Secondary | ICD-10-CM

## 2020-04-19 DIAGNOSIS — Z1371 Encounter for nonprocreative screening for genetic disease carrier status: Secondary | ICD-10-CM

## 2020-04-19 HISTORY — DX: Other specified personal risk factors, not elsewhere classified: Z91.89

## 2020-04-19 HISTORY — DX: Encounter for nonprocreative screening for genetic disease carrier status: Z13.71

## 2020-04-26 ENCOUNTER — Encounter: Payer: Self-pay | Admitting: Obstetrics and Gynecology

## 2020-04-26 ENCOUNTER — Ambulatory Visit (INDEPENDENT_AMBULATORY_CARE_PROVIDER_SITE_OTHER): Payer: 59 | Admitting: Obstetrics and Gynecology

## 2020-04-26 ENCOUNTER — Other Ambulatory Visit: Payer: Self-pay

## 2020-04-26 VITALS — BP 122/74 | Ht 65.0 in | Wt 160.0 lb

## 2020-04-26 DIAGNOSIS — Z01419 Encounter for gynecological examination (general) (routine) without abnormal findings: Secondary | ICD-10-CM

## 2020-04-26 DIAGNOSIS — R3121 Asymptomatic microscopic hematuria: Secondary | ICD-10-CM | POA: Diagnosis not present

## 2020-04-26 DIAGNOSIS — Z3041 Encounter for surveillance of contraceptive pills: Secondary | ICD-10-CM

## 2020-04-26 DIAGNOSIS — Z1211 Encounter for screening for malignant neoplasm of colon: Secondary | ICD-10-CM

## 2020-04-26 DIAGNOSIS — Z1321 Encounter for screening for nutritional disorder: Secondary | ICD-10-CM

## 2020-04-26 DIAGNOSIS — N951 Menopausal and female climacteric states: Secondary | ICD-10-CM

## 2020-04-26 DIAGNOSIS — Z1231 Encounter for screening mammogram for malignant neoplasm of breast: Secondary | ICD-10-CM

## 2020-04-26 DIAGNOSIS — Z8041 Family history of malignant neoplasm of ovary: Secondary | ICD-10-CM | POA: Insufficient documentation

## 2020-04-26 LAB — POCT URINALYSIS DIPSTICK
Bilirubin, UA: NEGATIVE
Glucose, UA: NEGATIVE
Ketones, UA: NEGATIVE
Leukocytes, UA: NEGATIVE
Nitrite, UA: NEGATIVE
Protein, UA: NEGATIVE
Spec Grav, UA: 1.02 (ref 1.010–1.025)
pH, UA: 6 (ref 5.0–8.0)

## 2020-04-26 MED ORDER — NORETHINDRONE ACET-ETHINYL EST 1-20 MG-MCG PO TABS: 1.0000 | ORAL_TABLET | Freq: Every day | ORAL | 3 refills | Status: DC

## 2020-04-26 NOTE — Patient Instructions (Addendum)
I value your feedback and entrusting us with your care. If you get a [facility] patient survey, I would appreciate you taking the time to let us know about your experience today. Thank you! ° °As of July 29, 2019, your lab results will be released to your MyChart immediately, before I even have a chance to see them. Please give me time to review them and contact you if there are any abnormalities. Thank you for your patience.  ° °[facility]: [phone] ° ° ° °

## 2020-04-26 NOTE — Progress Notes (Signed)
PCP: Patient, No Pcp Per   Chief Complaint  Patient presents with  . Annual Exam    HPI:      [name] is a 49 y.o. [phone] whose LMP was Patient's last menstrual period was 03/16/2020., presents today for her annual examination.  Her menses are Q3 months with cont dosing of OCPs, lasting 3 days lighter flow recently, but is usually 5 days mod flow.  Dysmenorrhea moderate, not relieved with NSAIDs/heating pad. Is getting worse. She does not have intermenstrual bleeding.  Ultrasound 2019 revealed SS and IM fibroids, the largest measured was 3.5 x 2.7 cm.  She is having worsening vasomotor sx since 4/20 (same time as husband passed away). Normal TSH 04-Nov-2022.  Sex activity: not sexually active. She does not have vaginal dryness.  Last Pap: 02/25/19  Results were: no abnormalities /neg HPV DNA 2019  Hx of STDs: HSV; HPV on pap, s/p LEEP 2010  Last mammogram: 10/13/19 at [facility];  Results were: normal--routine follow-up in 12 months. There is a FH of breast cancer in her pat aunt and mat aunt. There is a FH of ovarian cancer in her mat aunt, genetic testing not done. Also kidney cancer in her dad and PGM. The patient does do self-breast exams.  Colonoscopy: never, wants cologuard.  Tobacco use: The patient denies current or previous tobacco use. Alcohol use: none  No drug use Exercise: moderately active  She does get adequate calcium and Vitamin D in her diet. Hx of Vit D deficiency but had normal labs 2022/11/04.  Hx of B12 deficiency. Has not had recent recheck. Taking supp. Had normal folate 11-04-22.  Hx of hematuria with urology eval. Had renal u/s and CT scan at [facility], possible kidney stones. Due for cystoscopy with [facility] urology but then insurance changed and no longer covered. Needs new urologist.  Labs with PCP.   Past Medical History:  Diagnosis Date  . Abnormal Pap smear of cervix 2010   LGSIL with negative path on LEEP  . Allergy   . Asthma   . Family history of ovarian cancer     7/20 cancer genetic testing letter sent  . Genital herpes   . Genital warts   . Hematuria   . Vitamin D deficiency     Past Surgical History:  Procedure Laterality Date  . CERVICAL BIOPSY  W/ LOOP ELECTRODE EXCISION  2010   LGSIL benign pathology    Family History  Problem Relation Age of Onset  . Liver cancer Mother 35  . Diabetes Father   . Breast cancer Paternal Aunt 68  . Sarcoidosis Sister   . Ovarian cancer Maternal Aunt     Social History   Socioeconomic History  . Marital status: Widowed    Spouse name: Not on file  . Number of children: 3  . Years of education: Not on file  . Highest education level: Not on file  Occupational History  . Occupation: Glass blower/designer  Tobacco Use  . Smoking status: Never Smoker  . Smokeless tobacco: Never Used  Vaping Use  . Vaping Use: Never used  Substance and Sexual Activity  . Alcohol use: Yes    Alcohol/week: 0.0 standard drinks    Comment: occasional  . Drug use: No  . Sexual activity: Yes    Partners: Male    Birth control/protection: Pill  Other Topics Concern  . Not on file  Social History Narrative  . Not on file   Social Determinants of Health  Financial Resource Strain:   . Difficulty of Paying Living Expenses: Not on file  Food Insecurity:   . Worried About Charity fundraiser in the Last Year: Not on file  . Ran Out of Food in the Last Year: Not on file  Transportation Needs:   . Lack of Transportation (Medical): Not on file  . Lack of Transportation (Non-Medical): Not on file  Physical Activity:   . Days of Exercise per Week: Not on file  . Minutes of Exercise per Session: Not on file  Stress:   . Feeling of Stress : Not on file  Social Connections:   . Frequency of Communication with Friends and Family: Not on file  . Frequency of Social Gatherings with Friends and Family: Not on file  . Attends Religious Services: Not on file  . Active Member of Clubs or Organizations: Not on file  .  Attends Archivist Meetings: Not on file  . Marital Status: Not on file  Intimate Partner Violence:   . Fear of Current or Ex-Partner: Not on file  . Emotionally Abused: Not on file  . Physically Abused: Not on file  . Sexually Abused: Not on file     Current Outpatient Medications:  .  ALBUTEROL SULFATE HFA IN, Inhale into the lungs., Disp: , Rfl:  .  diclofenac (VOLTAREN) 75 MG EC tablet, Take 75 mg by mouth 2 (two) times daily., Disp: , Rfl:  .  diphenhydrAMINE (BENADRYL) 25 MG tablet, Take 25 mg by mouth as needed., Disp: , Rfl:  .  fluticasone furoate-vilanterol (BREO ELLIPTA) 100-25 MCG/INH AEPB, Inhale 1 puff into the lungs daily., Disp: , Rfl:  .  lidocaine-prilocaine (EMLA) cream, Apply 1 application topically as needed., Disp: 30 g, Rfl: 0 .  mirtazapine (REMERON) 15 MG tablet, , Disp: , Rfl:  .  norethindrone-ethinyl estradiol (JUNEL 1/20) 1-20 MG-MCG tablet, Take 1 tablet by mouth daily for 28 days., Disp: 84 tablet, Rfl: 3 .  tiZANidine (ZANAFLEX) 2 MG tablet, TAKE 1-2 TABLETS BY MOUTH THREE TIMES A DAY AS NEEDED FOR MUSCLE SPASM, Disp: , Rfl:  .  levocetirizine (XYZAL) 5 MG tablet, , Disp: , Rfl: 4 .  zolpidem (AMBIEN) 5 MG tablet, Take by mouth., Disp: , Rfl:      ROS:  Review of Systems  Constitutional: Positive for fatigue. Negative for fever and unexpected weight change.  Respiratory: Positive for shortness of breath and wheezing. Negative for cough.   Cardiovascular: Negative for chest pain, palpitations and leg swelling.  Gastrointestinal: Negative for blood in stool, constipation, diarrhea, nausea and vomiting.  Endocrine: Negative for cold intolerance, heat intolerance and polyuria.  Genitourinary: Positive for frequency. Negative for dyspareunia, dysuria, flank pain, genital sores, hematuria, menstrual problem, pelvic pain, urgency, vaginal bleeding, vaginal discharge and vaginal pain.  Musculoskeletal: Positive for arthralgias. Negative for back  pain, joint swelling and myalgias.  Skin: Negative for rash.  Neurological: Positive for numbness and headaches. Negative for dizziness, syncope and light-headedness.  Hematological: Negative for adenopathy.  Psychiatric/Behavioral: Negative for agitation, confusion, sleep disturbance and suicidal ideas. The patient is not nervous/anxious.    BREAST: No symptoms    Objective: BP 122/74   Ht 5\' 5"  (1.651 m)   Wt 160 lb (72.6 kg)   LMP 03/16/2020   BMI 26.63 kg/m    Physical Exam Constitutional:      Appearance: She is well-developed.  Genitourinary:     Vulva, vagina, cervix, uterus, right adnexa and left adnexa normal.  No vulval lesion or tenderness noted.     No vaginal discharge, erythema or tenderness.     No cervical polyp.     Uterus is not enlarged or tender.     No right or left adnexal mass present.     Right adnexa not tender.     Left adnexa not tender.  Neck:     Thyroid: No thyromegaly.  Cardiovascular:     Rate and Rhythm: Normal rate and regular rhythm.     Heart sounds: Normal heart sounds. No murmur heard.   Pulmonary:     Effort: Pulmonary effort is normal.     Breath sounds: Normal breath sounds.  Chest:     Breasts:        Right: No mass, nipple discharge, skin change or tenderness.        Left: No mass, nipple discharge, skin change or tenderness.  Abdominal:     Palpations: Abdomen is soft.     Tenderness: There is no abdominal tenderness. There is no guarding.  Musculoskeletal:        General: Normal range of motion.     Cervical back: Normal range of motion.  Neurological:     General: No focal deficit present.     Mental Status: She is alert and oriented to person, place, and time.     Cranial Nerves: No cranial nerve deficit.  Skin:    General: Skin is warm and dry.  Psychiatric:        Mood and Affect: Mood normal.        Behavior: Behavior normal.        Thought Content: Thought content normal.        Judgment: Judgment  normal.  Vitals reviewed.    Results for orders placed or performed in visit on 04/26/20 (from the past 24 hour(s))  POCT Urinalysis Dipstick     Status: Abnormal   Collection Time: 04/26/20  5:09 PM  Result Value Ref Range   Color, UA yellow    Clarity, UA clear    Glucose, UA Negative Negative   Bilirubin, UA neg    Ketones, UA neg    Spec Grav, UA 1.020 1.010 - 1.025   Blood, UA mod    pH, UA 6.0 5.0 - 8.0   Protein, UA Negative Negative   Urobilinogen, UA     Nitrite, UA neg    Leukocytes, UA Negative Negative   Appearance     Odor      Assessment/Plan:  Encounter for annual routine gynecological examination  Encounter for surveillance of contraceptive pills - Plan: norethindrone-ethinyl estradiol (JUNEL 1/20) 1-20 MG-MCG tablet; OCP RF.   Encounter for screening mammogram for malignant neoplasm of breast; pt current on mammo  Family history of ovarian cancer--MyRisk testing discussed and done today. Will call with results.   Perimenopausal vasomotor symptoms--normal vs increased stress. On estrogen OCPs and had normal TSH 2/21. Discussed ways to keep cool. F/u prn.   Screening for colon cancer - Plan: Cologuard; Cologuard ref, declines colonoscopy for now.  Encounter for vitamin deficiency screening - Plan: B12 and Folate Panel; check B12 levels due to hx of deficiency  Asymptomatic microscopic hematuria - Plan: Ambulatory referral to Urology; Pos POC UA today, refer to urology for cystoscopy/further eval. Procedures done at [facility] 2021   Meds ordered this encounter  Medications  . norethindrone-ethinyl estradiol (JUNEL 1/20) 1-20 MG-MCG tablet    Sig: Take 1 tablet by mouth daily for 28  days.    Dispense:  84 tablet    Refill:  3    Order Specific Question:   Supervising Provider    Answer:   Gae Dry [[id number]]            GYN counsel breast self exam, mammography screening, menopause, adequate intake of calcium and vitamin D, diet and exercise     F/U  Return in about 1 year (around 04/26/2021).  Leandra Vanderweele B. Gordon Carlson, PA-C 04/26/2020 5:11 PM

## 2020-04-27 ENCOUNTER — Encounter: Payer: Self-pay | Admitting: Obstetrics and Gynecology

## 2020-04-27 LAB — B12 AND FOLATE PANEL
Folate: 8.8 ng/mL (ref >3.0)
Vitamin B-12: 1047 pg/mL (ref 232–1245)

## 2020-05-03 ENCOUNTER — Ambulatory Visit: Payer: BLUE CROSS/BLUE SHIELD | Admitting: Urology

## 2020-05-05 DIAGNOSIS — J4520 Mild intermittent asthma, uncomplicated: Secondary | ICD-10-CM | POA: Insufficient documentation

## 2020-05-05 DIAGNOSIS — R3129 Other microscopic hematuria: Secondary | ICD-10-CM | POA: Insufficient documentation

## 2020-05-05 DIAGNOSIS — A6000 Herpesviral infection of urogenital system, unspecified: Secondary | ICD-10-CM | POA: Insufficient documentation

## 2020-05-09 ENCOUNTER — Encounter: Payer: Self-pay | Admitting: Obstetrics and Gynecology

## 2020-05-13 NOTE — Progress Notes (Signed)
05/16/2020 2:59 PM   Stacey Morris 1971-01-19 [id number]  Referring provider: Chad Cordial, PA-C [address] [place],  [place] [address] Chief Complaint  Patient presents with  . Hematuria    HPI: Stacey Morris is a 49 y.o. female who presents today for evaluation and management of asymptomatic microscopic hematuria.   She was seen by Carmelina Noun, PA at [facility] urology on 12/14/2019. She noted that her PCP kept putting her on antibiotics for a UTI since 03/2019. She only reported frequency but attributed it to drinking a lot of water. She did have some lower abdominal pain that felt like a period cramp.   UTI History: 06/22/19 100K Citrobacter I Cefuroxime 50-100K Serratia pan-sensitive 09/27/19 Citrobacter 10-25K pan-sensitive 10/19/19 25-50K Staph R Cipro, Levoquin, Oxacillin, PCN 12/08/19 MUGF    She last saw Alicia Copland, PA-C on 04/26/20 for an annual exam. UA showed moderate blood otherwise normal. Hx of hematuria with urology eval. Had renal u/s and CT scan at [facility], possible kidney stones. Due for cystoscopy with [facility] urology but then insurance changed and no longer covered. Needs new urologist.  Dysmenorrhea moderate, not relieved with NSAIDs/heating pad. Was getting worse. She did not have intermenstrual bleeding. Ultrasound 2019 revealed SS and IM fibroids, the largest measured was 3.5 x 2.7 cm.  She was having worsening vasomotor sx since 4/20 (same time as husband passed away).   There is a FH of breast cancer in her pat aunt and mat aunt. There is a FH of ovarian cancer in her mat aunt, genetic testing not done. Also kidney cancer in her dad and PGM.  She denies hematuria. She has seen blood in her stool on one occasion. She reports having urgency and frequency x 3 months. She has occasional back pain.   She feels bloated and denies putting on weight.   She recently did a hereditary genetic testing considering cancer runs on both sides of her family.   She notes  a bad experience with her last cysto. She notes that he trouble with the FR and did not have a smaller one. The procedure was stopped.  Review of chart at [facility] indicate that Dr. Algis Liming attempted cystoscopy with a 93 French, unsuccessful and thus the plan was to have her come back with a pediatric scope to complete her hematuria evaluation.  PMH: Past Medical History:  Diagnosis Date  . Abnormal Pap smear of cervix 2010   LGSIL with negative path on LEEP  . Allergy   . Asthma   . BRCA negative 04/2020   MyRisk neg except MSH6 VUS  . Family history of breast cancer   . Family history of ovarian cancer   . Genital herpes   . Genital warts   . Hematuria   . Increased risk of breast cancer 04/2020   IBIS=15.2%/riskscore=21.4%  . Vitamin D deficiency     Surgical History: Past Surgical History:  Procedure Laterality Date  . CERVICAL BIOPSY  W/ LOOP ELECTRODE EXCISION  2010   LGSIL benign pathology    Home Medications:  Allergies as of 05/16/2020      Reactions   Gabapentin    Prednisone Nausea And Vomiting      Medication List       Accurate as of May 16, 2020  2:59 PM. If you have any questions, ask your nurse or doctor.        STOP taking these medications   diazepam 5 MG tablet Commonly known as: VALIUM Stopped by:  Hollice Espy, MD   HYDROcodone-acetaminophen 5-325 MG tablet Commonly known as: NORCO/VICODIN Stopped by: Hollice Espy, MD   Junel FE 1/20 1-20 MG-MCG tablet Generic drug: norethindrone-ethinyl estradiol Stopped by: Hollice Espy, MD   levocetirizine 5 MG tablet Commonly known as: XYZAL Stopped by: Hollice Espy, MD     TAKE these medications   ALBUTEROL SULFATE HFA IN Inhale into the lungs.   Breo Ellipta 200-25 MCG/INH Aepb Generic drug: fluticasone furoate-vilanterol Inhale into the lungs. What changed: Another medication with the same name was removed. Continue taking this medication, and follow the directions you see  here. Changed by: Hollice Espy, MD   cyanocobalamin 1000 MCG tablet Take by mouth.   diclofenac 75 MG EC tablet Commonly known as: VOLTAREN Take 75 mg by mouth 2 (two) times daily.   diphenhydrAMINE 25 MG tablet Commonly known as: BENADRYL Take 25 mg by mouth as needed.   lidocaine 2 % jelly Commonly known as: XYLOCAINE Place into the urethra.   lidocaine-prilocaine cream Commonly known as: EMLA Apply 1 application topically as needed.   mirtazapine 7.5 MG tablet Commonly known as: REMERON Take by mouth. What changed: Another medication with the same name was removed. Continue taking this medication, and follow the directions you see here. Changed by: Hollice Espy, MD   norethindrone-ethinyl estradiol 1-20 MG-MCG tablet Commonly known as: Junel 1/20 Take 1 tablet by mouth daily for 28 days.   tiZANidine 2 MG tablet Commonly known as: ZANAFLEX TAKE 1-2 TABLETS BY MOUTH THREE TIMES A DAY AS NEEDED FOR MUSCLE SPASM   Vitamin D (Ergocalciferol) 1.25 MG (50000 UNIT) Caps capsule Commonly known as: DRISDOL Take by mouth.   zolpidem 5 MG tablet Commonly known as: AMBIEN Take by mouth.       Allergies:  Allergies  Allergen Reactions  . Gabapentin   . Prednisone Nausea And Vomiting    Family History: Family History  Problem Relation Age of Onset  . Liver cancer Mother 72  . Diabetes Father   . Breast cancer Paternal Aunt 74  . Sarcoidosis Sister   . Ovarian cancer Maternal Aunt     Social History:  reports that she has never smoked. She has never used smokeless tobacco. She reports current alcohol use. She reports that she does not use drugs.   Physical Exam: BP 134/88   Pulse (!) 105   Ht 5' 5" (1.651 m)   Wt 158 lb (71.7 kg)   BMI 26.29 kg/m   Constitutional:  Alert and oriented, No acute distress.  Somewhat anxious. HEENT: [place] AT, moist mucus membranes.  Trachea midline, no masses. Cardiovascular: No clubbing, cyanosis, or edema. Respiratory:  Normal respiratory effort, no increased work of breathing. Skin: No rashes, bruises or suspicious lesions. Neurologic: Grossly intact, no focal deficits, moving all 4 extremities. Psychiatric: Normal mood and affect.  Laboratory Data:  Urinalysis Results for orders placed or performed in visit on 05/16/20  Microscopic Examination   Urine  Result Value Ref Range   WBC, UA 0-5 0 - 5 /hpf   RBC 3-10 (A) 0 - 2 /hpf   Epithelial Cells (non renal) 0-10 0 - 10 /hpf   Renal Epithel, UA 0-10 (A) None seen /hpf   Casts Present (A) None seen /lpf   Cast Type Hyaline casts N/A   Mucus, UA Present (A) Not Estab.   Bacteria, UA Few None seen/Few  Urinalysis, Complete  Result Value Ref Range   Specific Gravity, UA >1.030 (H) 1.005 - 1.030  pH, UA 6.0 5.0 - 7.5   Color, UA Yellow Yellow   Appearance Ur Cloudy (A) Clear   Leukocytes,UA Negative Negative   Protein,UA Negative Negative/Trace   Glucose, UA Negative Negative   Ketones, UA Negative Negative   RBC, UA 2+ (A) Negative   Bilirubin, UA Negative Negative   Urobilinogen, Ur 0.2 0.2 - 1.0 mg/dL   Nitrite, UA Negative Negative   Microscopic Examination See below:       Assessment & Plan:    1. Microscopic hematuria Asymptomatic -microscopic blood present again today Status post recent imaging at [facility] which is reassuring Will schedule cystoscopy with gentle dilation of the urethra in the office, do not have access to pediatric scopes but with sedation, suspect she will be able to tolerate dilation up to 16 [place] [facility] with cystoscopy in the operating room, declined Patient agreed but is very anxious about procedure and will benefit from Valium.  Risk and warning signs reviewed.  -Valium Rx sent to pharmacy.   2. 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  UA not suspicious for infection, asymptomatic Discussed treating when ONLY showing symptoms. Patient understood.  3. OAB Patient has some urgency and  frequency. May or may not be related to recent infection.   -Given 4 Myrbetriq 25 mg daily, # 28 samples; I have advised the patient of the side effects of Myrbetriq, such as: elevation in BP, urinary retention and/or [facility]    [facility] [address], [place], [place] [address] [phone]  I, Selena Batten, am acting as a scribe for Dr. Hollice Espy.  I have reviewed the above documentation for accuracy and completeness, and I agree with the above.   Hollice Espy, MD  I spent 45 total minutes on the day of the encounter including pre-visit review of the medical record, face-to-face time with the patient, and post visit ordering of labs/imaging/tests.  Extensive notes from [facility] urology reviewed today along with imaging.

## 2020-05-16 ENCOUNTER — Ambulatory Visit (INDEPENDENT_AMBULATORY_CARE_PROVIDER_SITE_OTHER): Payer: 59 | Admitting: Urology

## 2020-05-16 ENCOUNTER — Other Ambulatory Visit: Payer: Self-pay

## 2020-05-16 VITALS — BP 134/88 | HR 105 | Ht 65.0 in | Wt 158.0 lb

## 2020-05-16 DIAGNOSIS — R3121 Asymptomatic microscopic hematuria: Secondary | ICD-10-CM

## 2020-05-16 DIAGNOSIS — R3915 Urgency of urination: Secondary | ICD-10-CM | POA: Diagnosis not present

## 2020-05-16 DIAGNOSIS — N390 Urinary tract infection, site not specified: Secondary | ICD-10-CM

## 2020-05-16 MED ORDER — DIAZEPAM 10 MG PO TABS: 10.0000 mg | ORAL_TABLET | Freq: Once | ORAL | 0 refills | Status: AC

## 2020-05-16 NOTE — Patient Instructions (Signed)
Cystoscopy Cystoscopy is a procedure that is used to help diagnose and sometimes treat conditions that affect the lower urinary tract. The lower urinary tract includes the bladder and the urethra. The urethra is the tube that drains urine from the bladder. Cystoscopy is done using a thin, tube-shaped instrument with a light and camera at the end (cystoscope). The cystoscope may be hard or flexible, depending on the goal of the procedure. The cystoscope is inserted through the urethra, into the bladder. Cystoscopy may be recommended if you have:  Urinary tract infections that keep coming back.  Blood in the urine (hematuria).  An inability to control when you urinate (urinary incontinence) or an overactive bladder.  Unusual cells found in a urine sample.  A blockage in the urethra, such as a urinary stone.  Painful urination.  An abnormality in the bladder found during an intravenous pyelogram (IVP) or CT scan. Cystoscopy may also be done to remove a sample of tissue to be examined under a microscope (biopsy). What are the risks? Generally, this is a safe procedure. However, problems may occur, including:  Infection.  Bleeding.  What happens during the procedure?  1. You will be given one or more of the following: ? A medicine to numb the area (local anesthetic). 2. The area around the opening of your urethra will be cleaned. 3. The cystoscope will be passed through your urethra into your bladder. 4. Germ-free (sterile) fluid will flow through the cystoscope to fill your bladder. The fluid will stretch your bladder so that your health care provider can clearly examine your bladder walls. 5. Your doctor will look at the urethra and bladder. 6. The cystoscope will be removed The procedure may vary among health care providers  What can I expect after the procedure? After the procedure, it is common to have: 1. Some soreness or pain in your abdomen and urethra. 2. Urinary symptoms.  These include: ? Mild pain or burning when you urinate. Pain should stop within a few minutes after you urinate. This may last for up to 1 week. ? A small amount of blood in your urine for several days. ? Feeling like you need to urinate but producing only a small amount of urine. Follow these instructions at home: General instructions  Return to your normal activities as told by your health care provider.   Do not drive for 24 hours if you were given a sedative during your procedure.  Watch for any blood in your urine. If the amount of blood in your urine increases, call your health care provider.  If a tissue sample was removed for testing (biopsy) during your procedure, it is up to you to get your test results. Ask your health care provider, or the department that is doing the test, when your results will be ready.  Drink enough fluid to keep your urine pale yellow.  Keep all follow-up visits as told by your health care provider. This is important. Contact a health care provider if you:  Have pain that gets worse or does not get better with medicine, especially pain when you urinate.  Have trouble urinating.  Have more blood in your urine. Get help right away if you:  Have blood clots in your urine.  Have abdominal pain.  Have a fever or chills.  Are unable to urinate. Summary  Cystoscopy is a procedure that is used to help diagnose and sometimes treat conditions that affect the lower urinary tract.  Cystoscopy is done using   a thin, tube-shaped instrument with a light and camera at the end.  After the procedure, it is common to have some soreness or pain in your abdomen and urethra.  Watch for any blood in your urine. If the amount of blood in your urine increases, call your health care provider.  If you were prescribed an antibiotic medicine, take it as told by your health care provider. Do not stop taking the antibiotic even if you start to feel better. This  information is not intended to replace advice given to you by your health care provider. Make sure you discuss any questions you have with your health care provider. Document Revised: 07/28/2018 Document Reviewed: 07/28/2018 Elsevier Patient Education  2020 Elsevier Inc.   

## 2020-05-17 ENCOUNTER — Telehealth: Payer: Self-pay | Admitting: Obstetrics and Gynecology

## 2020-05-17 ENCOUNTER — Encounter: Payer: Self-pay | Admitting: Obstetrics and Gynecology

## 2020-05-17 LAB — URINALYSIS, COMPLETE
Bilirubin, UA: NEGATIVE
Glucose, UA: NEGATIVE
Ketones, UA: NEGATIVE
Leukocytes,UA: NEGATIVE
Nitrite, UA: NEGATIVE
Protein,UA: NEGATIVE
Specific Gravity, UA: >1.03 — ABNORMAL HIGH (ref 1.005–1.030)
Urobilinogen, Ur: 0.2 mg/dL (ref 0.2–1.0)
pH, UA: 6 (ref 5.0–7.5)

## 2020-05-17 LAB — MICROSCOPIC EXAMINATION

## 2020-05-17 NOTE — Telephone Encounter (Signed)
Pt aware of neg MyRisk results except MSH6 VUS. IBIS=15.2%/riskscore=21.4%. Pt aware of recommendations for monthly SBE, yearly CBE and mammos, as well as scr breast MRI. Pt with dense breasts. Next mammo due 2/22. Pt to call for MRI ref if desires.   Patient understands these results only apply to her and her children, and this is not indicative of genetic testing results of her other family members. It is recommended that her other family members have genetic testing done.  Pt also understands negative genetic testing doesn't mean she will never get any of these cancers.   Hard copy mailed to pt. F/u prn.

## 2020-05-18 ENCOUNTER — Encounter: Payer: Self-pay | Admitting: Urology

## 2020-05-19 ENCOUNTER — Telehealth: Payer: Self-pay

## 2020-05-19 NOTE — Telephone Encounter (Signed)
Called patient in reference to [employer]. Work note was provided and sent through [employer] by patient request. Cysto previously rescheduled

## 2020-05-19 NOTE — Telephone Encounter (Signed)
Work note is fine for this week ONLY.  Please have her reschedule her cystoscopy.  Hollice Espy, MD

## 2020-05-25 ENCOUNTER — Other Ambulatory Visit: Payer: Self-pay | Admitting: Urology

## 2020-05-30 NOTE — Progress Notes (Signed)
05/31/2020  CC:  Chief Complaint  Patient presents with  . Cysto    HPI: Stacey Morris is a 49 y.o. female who returns for a cystoscopy.   She was seen by Carmelina Noun, PA at [facility] urology on 12/14/2019. She noted that her PCP kept putting her on antibiotics for a UTI since 03/2019. She only reported frequency but attributed it to drinking a lot of water. She did have some lower abdominal pain that felt like a period cramp.   UTI History: 06/22/19 100K Citrobacter I Cefuroxime 50-100K Serratia pan-sensitive 09/27/19 Citrobacter 10-25K pan-sensitive 10/19/19 25-50K Staph R Cipro, Levoquin, Oxacillin, PCN 12/08/19 MUGF    She last saw Alicia Copland, PA-C on 04/26/20 for an annual exam. UA showed moderate blood otherwise normal. Hx of hematuria with urology eval. Had renal u/s and CT scan at [facility], possible kidney stones. Due for cystoscopy with [facility] urology but then insurance changed and no longer covered. Needs new urologist.  Dysmenorrhea moderate,not relieved with NSAIDs/heating pad. Was getting worse. Shedid nothave intermenstrual bleeding. Ultrasound2019revealedSS and IM fibroids, the largest measured was 3.5 x 2.7 cm. Shewas having worseningvasomotor sx since 4/20 (same time as husband passed away).   There isaFH of breast cancer in her pat auntand mat aunt. There isaFH of ovarian cancer in her mat aunt, genetic testing not done.Also kidney cancer in her dad and PGM.  She denied hematuria. She haf seen blood in her stool on one occasion. She reported having urgency and frequency x 3 months. She had occasional back pain.   She felt bloated and denied putting on weight.   She recently did a hereditary genetic testing considering cancer runs on both sides of her family.   She noted a bad experience with her last cysto. She noted that he trouble with the FR and did not have a smaller one. The procedure was stopped.  Review of chart at [facility] indicate that Dr. Westly Pam  attempted cystoscopy with a 66 French, unsuccessful and thus the plan was to have her come back with a pediatric scope to complete her hematuria evaluation.    Blood pressure (!) 142/86, pulse (!) 103, height 5\' 5"  (1.651 m), weight 156 lb (70.8 kg). NED. A&Ox3.   No respiratory distress   Abd soft, NT, ND Normal external genitalia with patent urethral meatus  Cystoscopy Procedure Note  Patient identification was confirmed, informed consent was obtained, and patient was prepped using Betadine solution.  Lidocaine jelly was administered per urethral meatus.    Procedure: - Unable to use 16 FR cystoscope immediately. I used the female dilator 16 FR then 18 FR and patient tolerated it well.  At this point, I was able to advance a 56 [place] scope easily into the bladder. - Thorough search of the bladder revealed:    normal urethral meatus    normal urothelium    no stones    no ulcers     no tumors    no urethral polyps    no trabeculation  - Ureteral orifices were normal in position and appearance.  Post-Procedure: - Patient tolerated the procedure well  Assessment/ Plan:  1. 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  Given her history and anticipated discomfort related to dilation today, she was advised to use Azo and lidocaine jelly for discomfort with what she was provided Keflex 500 mg given today prophylactically for this procedure   3. OAB Unable to tolerate Myrbetriq due to side effects, only use for several days  RTC in 2 weeks with PA for symptoms recheck.    Fransico Him, am acting as a scribe for Dr. Hollice Espy.  I have reviewed the above documentation for accuracy and completeness, and I agree with the above.   Hollice Espy, MD

## 2020-05-31 ENCOUNTER — Other Ambulatory Visit: Payer: Self-pay

## 2020-05-31 ENCOUNTER — Encounter: Payer: Self-pay | Admitting: Urology

## 2020-05-31 ENCOUNTER — Ambulatory Visit (INDEPENDENT_AMBULATORY_CARE_PROVIDER_SITE_OTHER): Payer: 59 | Admitting: Urology

## 2020-05-31 VITALS — BP 142/86 | HR 103 | Ht 65.0 in | Wt 156.0 lb

## 2020-05-31 DIAGNOSIS — R3121 Asymptomatic microscopic hematuria: Secondary | ICD-10-CM

## 2020-05-31 MED ORDER — CEPHALEXIN 250 MG PO CAPS
500.0000 mg | ORAL_CAPSULE | Freq: Once | ORAL | Status: AC
  Administered 2020-05-31: 500 mg via ORAL

## 2020-06-01 LAB — URINALYSIS, COMPLETE
Bilirubin, UA: NEGATIVE
Glucose, UA: NEGATIVE
Ketones, UA: NEGATIVE
Leukocytes,UA: NEGATIVE
Nitrite, UA: NEGATIVE
Protein,UA: NEGATIVE
Specific Gravity, UA: 1.015 (ref 1.005–1.030)
Urobilinogen, Ur: 0.2 mg/dL (ref 0.2–1.0)
pH, UA: 6.5 (ref 5.0–7.5)

## 2020-06-01 LAB — MICROSCOPIC EXAMINATION: Bacteria, UA: NONE SEEN

## 2020-06-13 NOTE — Progress Notes (Signed)
05/16/2020 4:27 PM   Stacey Morris Jun 05, 1971 [id number]  Referring provider: No referring provider defined for this encounter. Chief Complaint  Patient presents with  . Follow-up    2-3wk recheck on urinary symptoms    HPI: Stacey Morris is a 49 y.o. female with intermediate risk hematuria and OAB who presents today for follow up.   Intermittent risk hematuria  Non-smoker.   Noncon CT in July 2021 No CT evidence of an acute intra-abdominal or intrapelvic process within the limitations of a noncontrast examination. Specifically, no evidence of hydronephrosis, nephrolithiasis, or ureterolithiasis.  The uterus appears enlarged with suspicion of fibroids. This can be further evaluated with ultrasound as clinically warranted.  Cystoscopy 05/2020 with Dr. Erlene Quan NED.    OAB Could not tolerate Myrbetriq due to side effects.  She was experiencing nausea with the Myrbetriq.  She was only able to take 2 tablets, so it is unknown whether or not it addressed her OAB.  She is still experiencing frequency and urgency although she states she drinks four 16 ounce bottles of water at work and drinks  Two 16 ounce bottles of water when she returns home.  Patient denies any modifying or aggravating factors.  Patient denies any gross hematuria, dysuria or suprapubic/flank pain.  Patient denies any fevers, chills, nausea or vomiting.    PMH: Past Medical History:  Diagnosis Date  . Abnormal Pap smear of cervix 2010   LGSIL with negative path on LEEP  . Allergy   . Asthma   . BRCA negative 04/2020   MyRisk neg except MSH6 VUS  . Family history of breast cancer   . Family history of kidney cancer   . Family history of ovarian cancer   . Genital herpes   . Genital warts   . Hematuria   . Increased risk of breast cancer 04/2020   IBIS=15.2%/riskscore=21.4%  . Vitamin D deficiency     Surgical History: Past Surgical History:  Procedure Laterality Date  . CERVICAL BIOPSY  W/ LOOP ELECTRODE  EXCISION  2010   LGSIL benign pathology    Home Medications:  Allergies as of 06/14/2020      Reactions   Gabapentin    Prednisone Nausea And Vomiting      Medication List       Accurate as of June 14, 2020  4:27 PM. If you have any questions, ask your nurse or doctor.        ALBUTEROL SULFATE HFA IN Inhale into the lungs.   Breo Ellipta 200-25 MCG/INH Aepb Generic drug: fluticasone furoate-vilanterol Inhale into the lungs.   cyanocobalamin 1000 MCG tablet Take by mouth.   diclofenac 75 MG EC tablet Commonly known as: VOLTAREN Take 75 mg by mouth 2 (two) times daily.   diphenhydrAMINE 25 MG tablet Commonly known as: BENADRYL Take 25 mg by mouth as needed.   Junel FE 1/20 1-20 MG-MCG tablet Generic drug: norethindrone-ethinyl estradiol Take by mouth.   lidocaine-prilocaine cream Commonly known as: EMLA Apply 1 application topically as needed.   mirtazapine 7.5 MG tablet Commonly known as: REMERON Take by mouth.   norethindrone-ethinyl estradiol 1-20 MG-MCG tablet Commonly known as: Junel 1/20 Take 1 tablet by mouth daily for 28 days.   tiZANidine 2 MG tablet Commonly known as: ZANAFLEX TAKE 1-2 TABLETS BY MOUTH THREE TIMES A DAY AS NEEDED FOR MUSCLE SPASM   zolpidem 5 MG tablet Commonly known as: AMBIEN Take by mouth.       Allergies:  Allergies  Allergen Reactions  . Gabapentin   . Prednisone Nausea And Vomiting    Family History: Family History  Problem Relation Age of Onset  . Liver cancer Mother 57  . Diabetes Father   . Breast cancer Paternal Aunt 55  . Sarcoidosis Sister   . Ovarian cancer Maternal Aunt     Social History:  reports that she has never smoked. She has never used smokeless tobacco. She reports current alcohol use. She reports that she does not use drugs.   Physical Exam: BP (!) 165/99   Pulse (!) 108   Ht '5\' 5"'  (1.651 m)   Wt 161 lb (73 kg)   BMI 26.79 kg/m   Constitutional:  Well nourished. Alert and  oriented, No acute distress. HEENT: [place] AT, mask in place.  Trachea midline Cardiovascular: No clubbing, cyanosis, or edema. Respiratory: Normal respiratory effort, no increased work of breathing. Neurologic: Grossly intact, no focal deficits, moving all 4 extremities. Psychiatric: Normal mood and affect.   Laboratory Data: Urinalysis Results for orders placed or performed in visit on 05/31/20  Microscopic Examination   Urine  Result Value Ref Range   WBC, UA 0-5 0 - 5 /hpf   RBC 0-2 0 - 2 /hpf   Epithelial Cells (non renal) 0-10 0 - 10 /hpf   Bacteria, UA None seen None seen/Few  Urinalysis, Complete  Result Value Ref Range   Specific Gravity, UA 1.015 1.005 - 1.030   pH, UA 6.5 5.0 - 7.5   Color, UA Yellow Yellow   Appearance Ur Hazy (A) Clear   Leukocytes,UA Negative Negative   Protein,UA Negative Negative/Trace   Glucose, UA Negative Negative   Ketones, UA Negative Negative   RBC, UA 1+ (A) Negative   Bilirubin, UA Negative Negative   Urobilinogen, Ur 0.2 0.2 - 1.0 mg/dL   Nitrite, UA Negative Negative   Microscopic Examination See below:     Assessment & Plan:    1.  Intermediate risk hematuria Recent work up 05/2020 was NED Patient is still concerned as to why she continues to have persistent micro heme, so I have offered her referral to nephrology which she would like to pursue Referral to nephrology in place RTC in one year for UA  2. OAB Patient has some urgency and frequency. Could not tolerate Myrbetriq due to intense nausea Given Gemtesa 75 mg daily, # 28 samples She will return in 3 weeks for OAB questionnaire and PVR   [facility] [address], [place], [place] [address] [phone]  Zara Council, PA-C

## 2020-06-14 ENCOUNTER — Encounter: Payer: Self-pay | Admitting: Urology

## 2020-06-14 ENCOUNTER — Ambulatory Visit (INDEPENDENT_AMBULATORY_CARE_PROVIDER_SITE_OTHER): Payer: 59 | Admitting: Urology

## 2020-06-14 ENCOUNTER — Other Ambulatory Visit: Payer: Self-pay

## 2020-06-14 VITALS — BP 165/99 | HR 108 | Ht 65.0 in | Wt 161.0 lb

## 2020-06-14 DIAGNOSIS — R3129 Other microscopic hematuria: Secondary | ICD-10-CM | POA: Diagnosis not present

## 2020-06-14 DIAGNOSIS — N3281 Overactive bladder: Secondary | ICD-10-CM | POA: Diagnosis not present

## 2020-06-14 DIAGNOSIS — R319 Hematuria, unspecified: Secondary | ICD-10-CM

## 2020-06-14 MED ORDER — GEMTESA 75 MG PO TABS: 75.0000 mg | ORAL_TABLET | Freq: Every day | ORAL | 0 refills | Status: DC

## 2020-06-14 NOTE — Patient Instructions (Signed)
[facility] -    [facility]  Dr. Murlean Iba  Dr. Lavonia Dana

## 2020-06-16 LAB — URINALYSIS, COMPLETE
Bilirubin, UA: NEGATIVE
Glucose, UA: NEGATIVE
Leukocytes,UA: NEGATIVE
Nitrite, UA: NEGATIVE
Protein,UA: NEGATIVE
Specific Gravity, UA: 1.025 (ref 1.005–1.030)
Urobilinogen, Ur: 0.2 mg/dL (ref 0.2–1.0)
pH, UA: 6.5 (ref 5.0–7.5)

## 2020-06-16 LAB — MICROSCOPIC EXAMINATION: Epithelial Cells (non renal): >10 /hpf — AB (ref 0–10)

## 2020-07-05 ENCOUNTER — Ambulatory Visit: Payer: Self-pay | Admitting: Urology

## 2020-07-17 ENCOUNTER — Telehealth: Payer: Self-pay | Admitting: Family Medicine

## 2020-07-17 NOTE — Telephone Encounter (Signed)
-----   Message from Nori Riis, PA-C sent at 07/17/2020  8:36 AM EST ----- According to the notes, it looks like Stacey Morris refused her nephrology referral.  I would like to see her in 6 months to recheck her urine and kidney function.

## 2020-07-17 NOTE — Telephone Encounter (Signed)
LMOM for patient to return call.

## 2020-07-19 NOTE — Telephone Encounter (Signed)
Unable to leave a message,mailbox is full. 

## 2020-07-19 NOTE — Telephone Encounter (Signed)
Patient called back and scheduled for 6 months  She wanted you to know that she did not refuse when they called her to schedule they told her she would have to verify her insurance before she could be seen there. Not sure why or what that was about.

## 2020-07-31 ENCOUNTER — Telehealth: Payer: Self-pay

## 2020-07-31 NOTE — Telephone Encounter (Signed)
Per [facility], called pt to follow up on test. Pt says she received kit and is planning to complete it. Waiting on her insurance to confirm coverage. Pt asked about her BC. That pharmacy sent text saying no RF's. [facility] sent 1 yr supply 04/26/20. Called pharmacy, spoke to [place], and she said Rx is in but RF is asking for JUNEL FE (zero RF's on that one by [facility]). Pt would like to know if she will have a side effect? The one yr supply is JUNEL. Please advise.

## 2020-08-01 NOTE — Telephone Encounter (Signed)
Called pt, no answer, mail box full, couldn't leave voice msg.

## 2020-08-01 NOTE — Telephone Encounter (Signed)
Both were listed on med list so I refilled the non-FE since she is doing cont dosing.They are the same pill except the FE has 7 days of placebo pills, but pt not taking them anyway. I can send in FE or pt can just fill the non-FE. Just let me know.

## 2020-08-28 ENCOUNTER — Telehealth: Payer: Self-pay

## 2020-08-28 NOTE — Telephone Encounter (Signed)
Patient states she received a call last month about her OCP Junel. She states it was set to pharmacy as 1 pack at a time. She needs 3 mo supply as she does 3 mos and then has a cycle. She also doesn't want to go to pharmacy monthly as the line is long. Cb#[phone]

## 2020-08-28 NOTE — Telephone Encounter (Signed)
Spoke w/pharmacy to clarify rx on file is accurate to what was sent 04/26/20 #84 w/3 refills. Pharmacy states patients insurance plan may have changed and prefers monthly fills.

## 2020-08-28 NOTE — Telephone Encounter (Signed)
LMVM to notify patient rx is accurate with [place] and pharmacy. She will need to contact her insurance regarding this.

## 2021-01-17 ENCOUNTER — Ambulatory Visit
Admission: EM | Admit: 2021-01-17 | Discharge: 2021-01-17 | Disposition: A | Discharge status: Home / Self Care | Payer: 59 | Attending: Family Medicine | Admitting: Family Medicine

## 2021-01-17 ENCOUNTER — Other Ambulatory Visit: Payer: Self-pay

## 2021-01-17 ENCOUNTER — Ambulatory Visit: Payer: 59 | Admitting: Urology

## 2021-01-17 ENCOUNTER — Encounter: Payer: Self-pay | Admitting: Emergency Medicine

## 2021-01-17 DIAGNOSIS — G629 Polyneuropathy, unspecified: Secondary | ICD-10-CM

## 2021-01-17 MED ORDER — DULOXETINE HCL 20 MG PO CPEP: 20.0000 mg | ORAL_CAPSULE | Freq: Every day | ORAL | 1 refills | Status: DC

## 2021-01-17 NOTE — ED Provider Notes (Signed)
[facility]    CSN: [id number] Arrival date & time: 01/17/21  1945      History   Chief Complaint Chief Complaint  Patient presents with  . Foot Pain   HPI  50 year old female presents with the above complaints.  Patient reports that over the past 3 months she is having worsening pain of her feet and toes.  Left greater than right.  She reports numbness and tingling and associated pain.  Pain 8/10 in severity.  Has been worsening.  No relieving factors.  Has previously been followed by neurology.  She has not been seen since 2021.   Past Medical History:  Diagnosis Date  . Abnormal Pap smear of cervix 2010   LGSIL with negative path on LEEP  . Allergy   . Asthma   . BRCA negative 04/2020   MyRisk neg except MSH6 VUS  . Family history of breast cancer   . Family history of kidney cancer   . Family history of ovarian cancer   . Genital herpes   . Genital warts   . Hematuria   . Increased risk of breast cancer 04/2020   IBIS=15.2%/riskscore=21.4%  . Vitamin D deficiency     Patient Active Problem List   Diagnosis Date Noted  . Hematuria, microscopic 05/05/2020  . Family history of ovarian cancer 04/26/2020  . Family history of kidney cancer 09/20/2019  . Asthma, stable, moderate persistent 06/21/2019  . Vitamin D deficiency 05/28/2019  . Insomnia 05/25/2019  . Low serum vitamin D 05/25/2019  . 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) 05/25/2019  . Intramural and subserous leiomyoma of uterus 02/25/2019  . DDD (degenerative disc disease), lumbosacral 02/26/2018  . Lumbar radiculopathy 02/26/2018  . Seasonal allergic rhinitis 02/26/2018  . Genital herpes   . Asthma   . Allergy   . Bilateral low back pain without sciatica 01/30/2015  . Abnormal Pap smear of cervix 08/19/2008    Past Surgical History:  Procedure Laterality Date  . CERVICAL BIOPSY  W/ LOOP ELECTRODE EXCISION  2010   LGSIL benign pathology    OB History     Gravida  4   Para  3   Term  3   Preterm      AB  1   Living  3     SAB  1   IAB      Ectopic      Multiple      Live Births  3            Home Medications    Prior to Admission medications   Medication Sig Start Date End Date Taking? Authorizing Provider  DULoxetine (CYMBALTA) 20 MG capsule Take 1 capsule (20 mg total) by mouth daily. Increase by 20 mg after 1 week, then an additional 20 mg after 2nd week to max of 60 mg daily. 01/17/21  Yes Leif Loflin G, DO  ALBUTEROL SULFATE HFA IN Inhale into the lungs.    [provider]  cyanocobalamin 1000 MCG tablet Take by mouth.    [provider]  diclofenac (VOLTAREN) 75 MG EC tablet Take 75 mg by mouth 2 (two) times daily.    [provider]  diphenhydrAMINE (BENADRYL) 25 MG tablet Take 25 mg by mouth as needed.    [provider]  fluticasone furoate-vilanterol (BREO ELLIPTA) 200-25 MCG/INH AEPB Inhale into the lungs. 09/27/19   [provider]  JUNEL FE 1/20 1-20 MG-MCG tablet Take by mouth. 05/30/20  [provider]  lidocaine-prilocaine (EMLA) cream Apply 1 application topically as needed. 01/31/20   Dalia Heading, CNM  mirtazapine (REMERON) 7.5 MG tablet Take by mouth. 05/05/20 08/03/20  [provider]  norethindrone-ethinyl estradiol (JUNEL 1/20) 1-20 MG-MCG tablet Take 1 tablet by mouth daily for 28 days. 04/26/20 04/25/34  Copland, Deirdre Evener, PA-C  tiZANidine (ZANAFLEX) 2 MG tablet TAKE 1-2 TABLETS BY MOUTH THREE TIMES A DAY AS NEEDED FOR MUSCLE SPASM 02/11/19   [provider]  Vibegron (GEMTESA) 75 MG TABS Take 75 mg by mouth daily. 06/14/20   Nori Riis, PA-C  zolpidem (AMBIEN) 5 MG tablet Take by mouth. 02/09/19 03/11/19  [provider]    Family History Family History  Problem Relation Age of Onset  . Liver cancer Mother 63  . Diabetes Father   . Breast cancer Paternal Aunt 59  . Sarcoidosis Sister   . Ovarian cancer  Maternal Aunt     Social History Social History   Tobacco Use  . Smoking status: Never Smoker  . Smokeless tobacco: Never Used  Vaping Use  . Vaping Use: Never used  Substance Use Topics  . Alcohol use: Yes    Alcohol/week: 0.0 standard drinks    Comment: occasional  . Drug use: No     Allergies   Gabapentin and Prednisone   Review of Systems Review of Systems Per HPI  Physical Exam Triage Vital Signs ED Triage Vitals  Enc Vitals Group     BP 01/17/21 1953 (!) 161/88     Pulse Rate 01/17/21 1953 91     Resp 01/17/21 1953 16     Temp 01/17/21 1953 99.2 F (37.3 C)     Temp Source 01/17/21 1953 Oral     SpO2 01/17/21 1953 100 %     Weight --      Height --      Head Circumference --      Peak Flow --      Pain Score 01/17/21 1954 8     Pain Loc --      Pain Edu? --      Excl. in [place]? --    Updated Vital Signs BP (!) 161/88 (BP Location: Left Arm)   Pulse 91   Temp 99.2 F (37.3 C) (Oral)   Resp 16   LMP 12/18/2020   SpO2 100%   Visual Acuity Right Eye Distance:   Left Eye Distance:   Bilateral Distance:    Right Eye Near:   Left Eye Near:    Bilateral Near:     Physical Exam Constitutional:      General: She is not in acute distress.    Appearance: Normal appearance. She is not ill-appearing.  HENT:     Head: Normocephalic and atraumatic.  Eyes:     General:        Right eye: No discharge.        Left eye: No discharge.     Conjunctiva/sclera: Conjunctivae normal.  Pulmonary:     Effort: Pulmonary effort is normal. No respiratory distress.  Musculoskeletal:     Comments: Right and left feet -exquisite tenderness on the plantar aspect of the feet diffusely.  Neurological:     Mental Status: She is alert.     UC Treatments / Results  Labs (all labs ordered are listed, but only abnormal results are displayed) Labs Reviewed - No data to display  EKG   Radiology No results found.  Procedures Procedures (  including critical care  time)  Medications Ordered in UC Medications - No data to display  Initial Impression / Assessment and Plan / UC Course  I have reviewed the triage vital signs and the nursing notes.  Pertinent labs & imaging results that were available during my care of the patient were reviewed by me and considered in my medical decision making (see chart for details).    50 year old female presents with the above complaints.  Patient has known peripheral neuropathy.  This appears to be the culprit of her symptomatology.  She has previously been seen by neurology and has had nerve conduction study which confirmed.  She does not tolerate gabapentin.  Trial of Cymbalta.  Needs to see neurology.  She was previously recommended to see [facility] for further evaluation and management.  Final Clinical Impressions(s) / UC Diagnoses   Final diagnoses:  Neuropathy     Discharge Instructions     Call [facility] for follow up/referral to [facility].  Medication as prescribed.  Good luck  Dr. Lacinda Axon    ED Prescriptions    Medication Sig Dispense Auth. Provider   DULoxetine (CYMBALTA) 20 MG capsule Take 1 capsule (20 mg total) by mouth daily. Increase by 20 mg after 1 week, then an additional 20 mg after 2nd week to max of 60 mg daily. 90 capsule Thersa Salt G, DO     PDMP not reviewed this encounter.   Coral Spikes, [place] 01/17/21 2029

## 2021-01-17 NOTE — Discharge Instructions (Signed)
Call [facility] for follow up/referral to [facility].  Medication as prescribed.  Good luck  Dr. Lacinda Axon

## 2021-01-17 NOTE — ED Triage Notes (Signed)
Pt here for intermittent bilateral foot pain onset 3 months but left is worse  Hx of plantar fascitis and nerve conduction problems.   Sx include pain, numbness/tingling.   Denies inj/trauma  A&O x4... NAD.Marland Kitchen. ambulatory

## 2021-02-08 ENCOUNTER — Other Ambulatory Visit: Payer: Self-pay | Admitting: Family Medicine

## 2021-02-13 ENCOUNTER — Other Ambulatory Visit (HOSPITAL_COMMUNITY): Payer: Self-pay | Admitting: Internal Medicine

## 2021-02-13 ENCOUNTER — Other Ambulatory Visit: Payer: Self-pay | Admitting: Internal Medicine

## 2021-02-13 DIAGNOSIS — M79672 Pain in left foot: Secondary | ICD-10-CM

## 2021-02-20 ENCOUNTER — Other Ambulatory Visit: Payer: Self-pay

## 2021-02-20 ENCOUNTER — Ambulatory Visit
Admission: RE | Admit: 2021-02-20 | Discharge: 2021-02-20 | Disposition: A | Discharge status: Home / Self Care | Payer: 59 | Source: Ambulatory Visit | Attending: Internal Medicine | Admitting: Internal Medicine

## 2021-02-20 DIAGNOSIS — M79672 Pain in left foot: Secondary | ICD-10-CM | POA: Diagnosis present

## 2021-02-20 MED ORDER — GADOBUTROL 1 MMOL/ML IV SOLN
7.0000 mL | Freq: Once | INTRAVENOUS | Status: AC | PRN
  Administered 2021-02-20: 7 mL via INTRAVENOUS

## 2021-03-11 DIAGNOSIS — G90522 Complex regional pain syndrome I of left lower limb: Secondary | ICD-10-CM | POA: Insufficient documentation

## 2021-04-27 LAB — COLOGUARD

## 2021-05-15 DIAGNOSIS — Z8739 Personal history of other diseases of the musculoskeletal system and connective tissue: Secondary | ICD-10-CM | POA: Insufficient documentation

## 2021-05-15 NOTE — Progress Notes (Signed)
Patient: Stacey Morris  Service Category: E/M  Provider: Gaspar Cola, MD  DOB: 1971/03/11  DOS: 05/16/2021  Referring Provider: Caroline More, DPM  MRN: [id number]  Setting: Ambulatory outpatient  PCP: Tracie Harrier, MD  Type: New Patient  Specialty: Interventional Pain Management    Location: Office  Delivery: Face-to-face     Primary Reason(s) for Visit: Encounter for initial evaluation of one or more chronic problems (new to examiner) potentially causing chronic pain, and posing a threat to normal musculoskeletal function. (Level of risk: High) CC: Ankle Pain (Left and right, right primary), Neck Pain (Shoulders bilateral,radiates up back of head to the top), and Back Pain  HPI  Stacey Morris is a 50 y.o. year old, female patient, who comes for the first time to our practice referred by Caroline More, DPM for our initial evaluation of her chronic pain. She has Genital herpes; Asthma; Allergies; Abnormal Pap smear of cervix; Intramural and subserous leiomyoma of uterus; Vitamin D deficiency; Family history of ovarian cancer; Asthma, stable, moderate persistent; Chronic low back pain (4th area of Pain) (Bilateral) (R>L) w/o sciatica; DDD (degenerative disc disease), lumbosacral; Family history of kidney cancer; Hematuria, microscopic; Insomnia; Low serum vitamin D; Lumbar radiculopathy; Major depressive disorder, recurrent, in partial remission ([place]); Seasonal allergic rhinitis; Asthma, stable, mild intermittent; Complex regional pain syndrome i of left lower limb; H/O degenerative disc disease; RAD (reactive airway disease), moderate persistent, uncomplicated; Chronic pain syndrome; Pharmacologic therapy; Disorder of skeletal system; Problems influencing health status; Chronic foot pain (1ry area of Pain) (Left); Chronic lower extremity pain (2ry area of Pain) (Left); Chronic knee pain (3ry area of Pain) (Bilateral) (L>R); Chronic neck pain (5th area of Pain) (Midline); Cervicogenic headache;  Occipital headache (Bilateral); Greater occipital neuralgia (Bilateral); Chronic shoulder pain (6th area of Pain) (Bilateral); Carpal tunnel syndrome (Right); Metatarsalgia of foot (Left); Tenosynovitis of ankle (Left); and 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 Today she comes in for evaluation of her Ankle Pain (Left and right, right primary), Neck Pain (Shoulders bilateral,radiates up back of head to the top), and Back Pain  Pain Assessment: Location: Lower, Right, Left Back Radiating: in past would radiate to buttocks bilateral and states its DDD Onset: More than a month ago Duration: Chronic pain Quality: Sharp, Discomfort, Crying, Other (Comment) (Intense,"Feels like i am walking on marbles" foot, will turn colors great toe, electricity then will effect all toes,) Severity: 1 /10 (subjective, self-reported pain score)  Effect on ADL: sitting on commmode, prolonge walking and standing, "I cant put anything on my feet Timing: Constant Modifying factors: resp.heat, BP: (!) 141/108  HR: (!) 132  Onset and Duration: Sudden and Present longer than 3 months Cause of pain: Unknown Severity: No change since onset, NAS-11 at its worse: 10/10, NAS-11 at its best: 5/10, and NAS-11 on the average: 7/10 Timing: Not influenced by the time of the day Aggravating Factors: Bending, Kneeling, Motion, Prolonged sitting, Prolonged standing, Squatting, Stooping , Twisting, Walking, Walking uphill, and Walking downhill Alleviating Factors:  nothing Associated Problems: Color changes, Depression, Fatigue, Inability to concentrate, Numbness, Sadness, Spasms, Sweating, Swelling, Temperature changes, Tingling, Weakness, Pain that wakes patient up, and Pain that does not allow patient to sleep Quality of Pain: Aching, Agonizing, Annoying, Burning, Deep, Disabling, Distressing, Exhausting, Feeling of weight, Heavy, Horrible, Hot, Pressure-like, Pulsating, Sharp,  Shooting, Stabbing, Tender, Throbbing, Tingling, and Uncomfortable Previous Examinations or Tests: EMG/PNCV, MRI scan, Nerve block, X-rays, Nerve conduction test, Neurological evaluation, and Orthopedic evaluation Previous Treatments:  Epidural steroid injections, Narcotic medications, Steroid treatments by mouth, and TENS  The patient comes into our practice today for the first time referred to [place] by Dr. Caroline More with a diagnosis of left foot pain and lower back pain requesting a consultation for specialty services and pain management.  Today's visit was rather long since the patient was very talkative, but in a disorganized manner.   According to the patient the primary area of pain is that of the left foot.  She denies any surgeries or physical therapy but she admits to having had some injections done as well as some x-rays.  The patient has an MRI of the left foot done on 02/20/2021 with evidence of a mild focal posterior tibial tendon tenosynovitis above the medial malleolus as well as a somewhat diminutive appearance of the inframalleolar peroneal brevis tendon, with no other findings to explain her neuropathic pain.  Specifically the MRI indicates "no evidence of intermetatarsal neuroma or bursitis.".  She indicates having a diagnosis or history of [place] fasciitis and was told that she has a complex regional pain syndrome.  The patient has already been seen by an orthopedic surgeon at [facility], and has a neurologist.  Even though her allergy list includes gabapentin and prednisone, this does not seem to be accurate since she recently received a prednisone taper.  Apparently its not an allergy, but an intolerance were it seems to exacerbate her depression.  Her history of depression seems to have been tied to her husband's death.  The patient describes the location of the pain as being over the top of her left foot and her calf area were he feels as if somebody was pulling that area.  The topicals  a possible "tarsal tunnel syndrome" has apparently, before.  She describes the pain to be in the foot and ankle and she is unable to tolerate cold air.  She describes apparently having allodynia over the portion of the foot.  However, the pattern is not necessarily dermatomal or "stocking-like/boot like".  She describes having significant sensitivity in her left foot with a "feeling as if she was walking on marbles".  She also describes having been told in the past that she has degenerative disc disease.  She apparently was also given a tentative diagnosis of "complex regional pain syndrome" and she was started on "scramble therapy".  She indicated that this therapy was given to her by Dr. Manuella Ghazi in his office and it consisted of 12 days of treatment.  When this was started, according to her a cost all of her other pains to flareup.  By this she indicated having had a flareup of pain in her scalp, shoulder, lower back, arms, as well as tingling in her head.  Some of these have stopped and decreased since the "scramble therapy" was terminated.  The patient also describes that the pain in her foot started on the first week of June 2022, "out of the blue".  She refers that the only thing that she remembers is having dropped a bottle of moisturizer onto her left foot, but this happened several months before the pain started and did not seem to cause any major problems immediately after it happened.  Therefore her current cause of this seems to be unknown.  She describes the exact location of the pain to be in the "ball of the foot", and the area usually affected by metatarsalgia, a condition typically seen among athletes due to overuse, but it can also be associated  with certain food wear (poorly fitted shoes with a narrow toe box) or foot deformities (hammertoes, bunions, or high arches) or other medical conditions such as Morton's neuroma or capsulitis.  The patient indicates having seen a podiatrist (Dr. Luana Shu at  [facility]).        The patient is secondary area pain is that of the left lower extremity described to be from the foot to the knee with no history of surgeries, nerve blocks, recent x-rays, or physical therapy.  Review of medical records indicated the patient to have had a nerve conduction test done on 06/30/2019 shown to be an abnormal electrodiagnostic study consistent with generalized sensory polyneuropathy with no evidence at that time of any superimposed lumbosacral radiculopathy.  The patient's third area pain is that of the knees (Bilateral) (L>R).  She denies any surgeries, recent x-rays, injections, physical therapy.  The patient's fourth area pain is that of the lower back (Bilateral) (R>L).  The patient denies any surgeries, physical therapy or injection therapy.  She later clarified having had some physical therapy many years ago but it did not help and in fact it worsened her low back pain.  The patient indicated having had some x-rays.  The patient also indicated that the low back pain used to referred pain towards the right buttocks area.  The patient is fifth area pain is that of the neck (posterior aspect) (Midline) with decreased range of motion.  She denies any prior surgeries, nerve blocks, or physical therapy.  She does indicate having had some x-rays in the past.  The patient's sixth area pain is that of the shoulder (Bilateral).  She indicated that this pain started when she began having treatments to "scramble her nerves" (?).  She denies any surgeries, nerve blocks, physical therapy, or x-rays.  The patient's seventh area pain is that of the top of the head, associated with occipital headaches (Bilateral) and what appears to be the distribution of the greater occipital nerve (Bilateral).  The patient denies any surgeries, x-rays, nerve blocks, or physical therapy.  The patient's eighth area pain is that of the right hand and wrist where she indicates having been  diagnosed with a right sided carpal tunnel syndrome.  She also indicated having had a nerve block by Dr. Manuella Ghazi, which did not help the pain and it made it worse.  Review of the medical record indicate that the patient had a nerve conductions test done on 06/29/2019 read to be an abnormal electrodiagnostic exam consistent with a right minimal (grade 1) carpal tunnel syndrome (median nerve entrapment at wrist).  Review of Dr. Trena Platt notes on 07/23/2019 indicate date of service he performed a right median nerve block with ultrasound guidance where he injected 0.5 mL of 2% lidocaine (20 mg/mL) + 0.5 mL of triamcinolone acetonide (40 mg/mL) (for a total of 1 mL).  Pharmacotherapy: The patient has been prescribed pregabalin (Lyrica 25 mg capsule with instructions to take 1 twice daily (2022).  In addition she has been prescribed Ambien 5 mg, 1 tab p.o. at bedtime for insomnia (2021).  Tizanidine (Zanaflex) 2 mg, 1 to 2 tablets by mouth 3 times daily for muscle spasms (2021); lidocaine (LMX) 4% cream, apply topically to times daily (2021).  Most recently the patient was given a prednisone taper at [facility].  Today I took the time to provide the patient with information regarding my pain practice. The patient was informed that my practice is divided into two sections: an interventional pain management  section, as well as a completely separate and distinct medication management section. I explained that I have procedure days for my interventional therapies, and evaluation days for follow-ups and medication management. Because of the amount of documentation required during both, they are kept separated. This means that there is the possibility that she may be scheduled for a procedure on one day, and medication management the next. I have also informed her that because of staffing and facility limitations, I no longer take patients for medication management only. To illustrate the reasons for this, I gave the patient the  example of surgeons, and how inappropriate it would be to refer a patient to his/her care, just to write for the post-surgical antibiotics on a surgery done by a different surgeon.   Because interventional pain management is my board-certified specialty, the patient was informed that joining my practice means that they are open to any and all interventional therapies. I made it clear that this does not mean that they will be forced to have any procedures done. What this means is that I believe interventional therapies to be essential part of the diagnosis and proper management of chronic pain conditions. Therefore, patients not interested in these interventional alternatives will be better served under the care of a different practitioner.  The patient was also made aware of my Comprehensive Pain Management Safety Guidelines where by joining my practice, they limit all of their nerve blocks and joint injections to those done by our practice, for as long as we are retained to manage their care.   Historic Controlled Substance Pharmacotherapy Review  PMP and historical list of controlled substances: Zolpidem 5 mg, 1 daily; hydrocodone/APAP 5/325 1 4 times daily (#5) (last filled on 03/16/2021); tramadol 50 mg 1 tablet p.o. 4 times daily (#5) (last filled on 01/23/2021) Current opioid analgesics: .   None MME/day: 0 mg/day   Historical Monitoring: The patient  reports no history of drug use. List of all UDS Test(s): No results found for: MDMA, COCAINSCRNUR, [place], [place], CANNABQUANT, THCU, [place] List of other Serum/Urine Drug Screening Test(s):  No results found for: AMPHSCRSER, BARBSCRSER, BENZOSCRSER, COCAINSCRSER, COCAINSCRNUR, PCPSCRSER, PCPQUANT, THCSCRSER, THCU, CANNABQUANT, OPIATESCRSER, OXYSCRSER, PROPOXSCRSER, ETH Historical Background Evaluation: [place] PMP: PDMP reviewed during this encounter. Online review of the past 43-monthperiod conducted.             PMP NARX Score Report:  Narcotic:  250 Sedative: 281 Stimulant: 000 [place] Department of public safety, offender search: (Editor, commissioningInformation) Non-contributory Risk Assessment Profile: Aberrant behavior: None observed or detected today Risk factors for fatal opioid overdose: None identified today PMP NARX Overdose Risk Score: 340 Fatal overdose hazard ratio (HR): Calculation deferred Non-fatal overdose hazard ratio (HR): Calculation deferred Risk of opioid abuse or dependence: 0.7-3.0% with doses ? 36 MME/day and 6.1-26% with doses ? 120 MME/day. Substance use disorder (SUD) risk level: See below Personal History of Substance Abuse (SUD-Substance use disorder):  Alcohol: Negative  Illegal Drugs: Negative  Rx Drugs: Negative  ORT Risk Level calculation: Low Risk  Opioid Risk Tool - 05/16/21 1428       Family History of Substance Abuse   Alcohol Negative    Illegal Drugs Negative    Rx Drugs Negative      Personal History of Substance Abuse   Alcohol Negative    Illegal Drugs Negative    Rx Drugs Negative      Psychological Disease   Psychological Disease Positive    ADD Negative  OCD Negative    Bipolar Negative    Schizophrenia Negative    Depression Positive   2 years ago after cancer and death of husband     Total Score   Opioid Risk Tool Scoring 3    Opioid Risk Interpretation Low Risk            ORT Scoring interpretation table:  Score <3 = Low Risk for SUD  Score between 4-7 = Moderate Risk for SUD  Score >8 = High Risk for Opioid Abuse   PHQ-2 Depression Scale:  Total score: 2  PHQ-2 Scoring interpretation table: (Score and probability of major depressive disorder)  Score 0 = No depression  Score 1 = 15.4% Probability  Score 2 = 21.1% Probability  Score 3 = 38.4% Probability  Score 4 = 45.5% Probability  Score 5 = 56.4% Probability  Score 6 = 78.6% Probability   PHQ-9 Depression Scale:  Total score: 9  PHQ-9 Scoring interpretation table:  Score 0-4 = No depression  Score 5-9 = Mild  depression  Score 10-14 = Moderate depression  Score 15-19 = Moderately severe depression  Score 20-27 = Severe depression (2.4 times higher risk of SUD and 2.89 times higher risk of overuse)   Pharmacologic Plan: As per protocol, I have not taken over any controlled substance management, pending the results of ordered tests and/or consults.            Initial impression: Pending review of available data and ordered tests.  Meds   Current Outpatient Medications:    ALBUTEROL SULFATE HFA IN, Inhale into the lungs., Disp: , Rfl:    diphenhydrAMINE (BENADRYL) 25 MG tablet, Take 25 mg by mouth as needed., Disp: , Rfl:    JUNEL FE 1/20 1-20 MG-MCG tablet, Take by mouth., Disp: , Rfl:    lidocaine-prilocaine (EMLA) cream, Apply 1 application topically as needed., Disp: 30 g, Rfl: 0   montelukast (SINGULAIR) 10 MG tablet, Take by mouth., Disp: , Rfl:    nortriptyline (PAMELOR) 10 MG capsule, Take 10 mg by mouth at bedtime., Disp: , Rfl:    predniSONE (STERAPRED UNI-PAK 21 TAB) 10 MG (21) TBPK tablet, Take by mouth., Disp: , Rfl:    tiZANidine (ZANAFLEX) 2 MG tablet, TAKE 1-2 TABLETS BY MOUTH THREE TIMES A DAY AS NEEDED FOR MUSCLE SPASM, Disp: , Rfl:    traMADol (ULTRAM) 50 MG tablet, Take 50 mg by mouth every 6 (six) hours as needed., Disp: , Rfl:    mirtazapine (REMERON) 7.5 MG tablet, Take by mouth., Disp: , Rfl:    norethindrone-ethinyl estradiol (JUNEL 1/20) 1-20 MG-MCG tablet, Take 1 tablet by mouth daily for 28 days., Disp: 84 tablet, Rfl: 3   zolpidem (AMBIEN) 5 MG tablet, Take by mouth., Disp: , Rfl:   Imaging Review  Foot Imaging: Foot-R DG Complete: Results for orders placed in visit on 11/10/14 DG Foot Complete Right  Narrative 3 views of a skeletally mature individual were obtained of the right foot.  Study includes AP, oblique, lateral projections.  There is no definitive evidence of acute fracture or stress fracture identified this time. Overall rectus foot type. Joint space  maintained.  Foot-L DG Complete: Results for orders placed in visit on 11/10/14 DG Foot Complete Left  Narrative 3 views of a skeletally mature individual were obtained of the left foot.  Study includes AP, oblique, lateral projections.  There is no definitive evidence of acute fracture stress fracture. Joint space is maintained. Overall rectus foot type.  Complexity Note: Imaging results reviewed. Results shared with Ms. Thalia Bloodgood, using Layman's terms.                        ROS  Cardiovascular: No reported cardiovascular signs or symptoms such as High blood pressure, coronary artery disease, abnormal heart rate or rhythm, heart attack, blood thinner therapy or heart weakness and/or failure Pulmonary or Respiratory: Wheezing and difficulty taking a deep full breath (Asthma) Neurological: Abnormal skin sensations (Peripheral Neuropathy) Psychological-Psychiatric: Depressed and Difficulty sleeping and or falling asleep Gastrointestinal: Reflux or heatburn Genitourinary: Peeing blood and Recurrent Urinary Tract infections Hematological: No reported hematological signs or symptoms such as prolonged bleeding, low or poor functioning platelets, bruising or bleeding easily, hereditary bleeding problems, low energy levels due to low hemoglobin or being anemic Endocrine: No reported endocrine signs or symptoms such as high or low blood sugar, rapid heart rate due to high thyroid levels, obesity or weight gain due to slow thyroid or thyroid disease Rheumatologic: "Not sure what kind of arthritis" Musculoskeletal: Negative for myasthenia gravis, muscular dystrophy, multiple sclerosis or malignant hyperthermia Work History: Disabled  Allergies  Ms. Fallas is allergic to gabapentin and prednisone.  Laboratory Chemistry Profile   Renal Lab Results  Component Value Date   BUN 18 05/16/2021   CREATININE 0.94 05/16/2021   BCR 19 05/16/2021   SPECGRAV 1.025 06/14/2020   PHUR 6.5 06/14/2020    PROTEINUR Negative 06/14/2020     Electrolytes Lab Results  Component Value Date   NA 139 05/16/2021   K 4.2 05/16/2021   CL 99 05/16/2021   CALCIUM 9.9 05/16/2021   MG 2.4 (H) 05/16/2021     Hepatic Lab Results  Component Value Date   AST 9 05/16/2021   ALBUMIN 4.4 05/16/2021   ALKPHOS 91 05/16/2021     ID Lab Results  Component Value Date   SARSCOV2NAA Not Detected 08/11/2019     Bone Lab Results  Component Value Date   25OHVITD1 WILL FOLLOW 05/16/2021   25OHVITD2 WILL FOLLOW 05/16/2021   25OHVITD3 WILL FOLLOW 05/16/2021     Endocrine Lab Results  Component Value Date   GLUCOSE 96 05/16/2021   GLUCOSEU Negative 06/14/2020     Neuropathy Lab Results  Component Value Date   VITAMINB12 824 05/16/2021   FOLATE 8.8 04/26/2020     CNS No results found for: COLORCSF, APPEARCSF, RBCCOUNTCSF, WBCCSF, POLYSCSF, LYMPHSCSF, EOSCSF, PROTEINCSF, GLUCCSF, JCVIRUS, CSFOLI, IGGCSF, LABACHR, ACETBL, LABACHR, ACETBL   Inflammation (CRP: Acute  ESR: Chronic) Lab Results  Component Value Date   CRP 3 05/16/2021   ESRSEDRATE 26 05/16/2021     Rheumatology No results found for: RF, ANA, LABURIC, URICUR, LYMEIGGIGMAB, LYMEABIGMQN, HLAB27   Coagulation No results found for: INR, LABPROT, APTT, PLT, DDIMER, LABHEMA, VITAMINK1, AT3   Cardiovascular No results found for: BNP, CKTOTAL, CKMB, TROPONINI, HGB, HCT, LABVMA, EPIRU, EPINEPH24HUR, NOREPRU, NOREPI24HUR, DOPARU, ASTMH96QIWL   Screening Lab Results  Component Value Date   SARSCOV2NAA Not Detected 08/11/2019     Cancer No results found for: CEA, CA125, LABCA2   Allergens No results found for: ALMOND, APPLE, ASPARAGUS, AVOCADO, BANANA, BARLEY, BASIL, BAYLEAF, GREENBEAN, LIMABEAN, WHITEBEAN, BEEFIGE, REDBEET, BLUEBERRY, BROCCOLI, CABBAGE, MELON, CARROT, CASEIN, CASHEWNUT, CAULIFLOWER, CELERY     Note: Lab results reviewed.  [place]  Drug: Stacey Morris  reports no history of drug use. Alcohol:  reports current alcohol  use. Tobacco:  reports that she has never smoked. She has never used smokeless tobacco. Medical:  has a past medical history of Abnormal Pap smear of cervix (2010), Allergy, Asthma, BRCA negative (04/2020), Family history of breast cancer, Family history of kidney cancer, Family history of ovarian cancer, Genital herpes, Genital warts, Hematuria, Increased risk of breast cancer (04/2020), and Vitamin D deficiency. Family: family history includes Breast cancer (age of onset: 74) in her paternal aunt; Diabetes in her father; Liver cancer (age of onset: 66) in her mother; Ovarian cancer in her maternal aunt; Sarcoidosis in her sister.  Past Surgical History:  Procedure Laterality Date   CERVICAL BIOPSY  W/ LOOP ELECTRODE EXCISION  2010   LGSIL benign pathology   Active Ambulatory Problems    Diagnosis Date Noted   Genital herpes 05/05/2020   Asthma    Allergies    Abnormal Pap smear of cervix 08/19/2008   Intramural and subserous leiomyoma of uterus 02/25/2019   Vitamin D deficiency 05/28/2019   Family history of ovarian cancer 04/26/2020   Asthma, stable, moderate persistent 06/21/2019   Chronic low back pain (4th area of Pain) (Bilateral) (R>L) w/o sciatica 01/30/2015   DDD (degenerative disc disease), lumbosacral 02/26/2018   Family history of kidney cancer 09/20/2019   Hematuria, microscopic 05/05/2020   Insomnia 05/25/2019   Low serum vitamin D 05/25/2019   Lumbar radiculopathy 02/26/2018   Major depressive disorder, recurrent, in partial remission ([place]) 05/25/2019   Seasonal allergic rhinitis 02/26/2018   Asthma, stable, mild intermittent 05/05/2020   Complex regional pain syndrome i of left lower limb 03/11/2021   H/O degenerative disc disease 05/15/2021   RAD (reactive airway disease), moderate persistent, uncomplicated 16/05/9603   Chronic pain syndrome 05/16/2021   Pharmacologic therapy 05/16/2021   Disorder of skeletal system 05/16/2021   Problems influencing health status  05/16/2021   Chronic foot pain (1ry area of Pain) (Left) 05/16/2021   Chronic lower extremity pain (2ry area of Pain) (Left) 05/16/2021   Chronic knee pain (3ry area of Pain) (Bilateral) (L>R) 05/16/2021   Chronic neck pain (5th area of Pain) (Midline) 05/16/2021   Cervicogenic headache 05/16/2021   Occipital headache (Bilateral) 05/16/2021   Greater occipital neuralgia (Bilateral) 05/16/2021   Chronic shoulder pain (6th area of Pain) (Bilateral) 05/16/2021   Carpal tunnel syndrome (Right) 05/16/2021   Metatarsalgia of foot (Left) 05/18/2021   Tenosynovitis of ankle (Left) 05/18/2021   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 In no apparent acute distress Vitals:   05/16/21 1403  BP: (!) 141/108  Pulse: (!) 132  Resp: 16  Temp: (!) 97.2 F (36.2 C)  SpO2: 100%  Weight: 138 lb (62.6 kg)  Height: 5' 4.5" (1.638 m)   BMI Assessment: Estimated body mass index is 23.32 kg/m as calculated from the following:   Height as of this encounter: 5' 4.5" (1.638 m).   Weight as of this encounter: 138 lb (62.6 kg).  BMI interpretation table: BMI level Category Range association with higher incidence of chronic pain  <18 kg/m2 Underweight   18.5-24.9 kg/m2 Ideal body weight   25-29.9 kg/m2 Overweight Increased incidence by 20%  30-34.9 kg/m2 Obese (  Class I) Increased incidence by 68%  35-39.9 kg/m2 Severe obesity (Class II) Increased incidence by 136%  >40 kg/m2 Extreme obesity (Class III) Increased incidence by 254%   Patient's current BMI Ideal Body weight  Body mass index is 23.32  kg/m. Ideal body weight: 55.8 kg (123 lb 2 oz) Adjusted ideal body weight: 58.5 kg (129 lb 1.2 oz)   BMI Readings from Last 4 Encounters:  05/16/21 23.32 kg/m  06/14/20 26.79 kg/m  05/31/20 25.96 kg/m  05/16/20 26.29 kg/m   Wt Readings from Last 4 Encounters:  05/16/21 138 lb (62.6 kg)  06/14/20 161 lb (73 kg)  05/31/20 156 lb (70.8 kg)  05/16/20 158 lb (71.7 kg)    Psych/Mental status: Alert, oriented x 3 (person, place, & time)       Eyes: PERLA Respiratory: No evidence of acute respiratory distress  Assessment  Primary Diagnosis & Pertinent Problem List: The primary encounter diagnosis was Chronic foot pain (1ry area of Pain) (Left). 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  Visit Diagnosis (New problems to examiner): 1. Chronic foot pain (1ry area of Pain) (Left)   2. Metatarsalgia of foot (Left)   3. Tenosynovitis of ankle (Left)   4. Chronic lower extremity pain (2ry area of Pain) (Left)   5. 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 Chronic knee pain (3ry area of Pain) (Bilateral) (L>R)   7. Chronic low back pain (4th area of Pain) (Bilateral) (R>L) w/o sciatica   8. Chronic neck pain (5th area of Pain) (Midline)   9. Chronic shoulder pain (6th area of Pain) (Bilateral)   10. Cervicogenic headache    11. Occipital headache (Bilateral)   12. Greater occipital neuralgia (Bilateral)   13. Carpal tunnel syndrome (Right)   14. Chronic pain syndrome   15. Pharmacologic therapy   16. Disorder of skeletal system   17. Problems influencing health status    Plan of Care (Initial workup plan)  Note: Stacey Morris was reminded that as per protocol, today's visit has been an evaluation only. We have not taken over the patient's controlled substance management.  Problem-specific plan: No problem-specific Assessment & Plan notes found for this encounter. Lab Orders         Compliance Drug Analysis, Ur         Comp. Metabolic Panel (12)         Magnesium         Vitamin B12         Sedimentation rate         25-Hydroxy vitamin D Lcms D2+D3         C-reactive protein     Imaging Orders         DG Cervical Spine With Flex & Extend         DG Lumbar Spine Complete W/Bend         DG Shoulder Right         DG Shoulder Left  DG Foot Complete Left         DG Ankle Complete Left         DG Knee Complete 4 Views Right         DG Knee Complete 4 Views Left     Referral Orders  No referral(s) requested today   Procedure Orders    No procedure(s) ordered today   Pharmacotherapy (current): Medications ordered:  No orders of the defined types were placed in this encounter.  Medications administered during this visit: Stacey Morris had no medications administered during this visit.   Pharmacological management options:  Opioid Analgesics: The patient was informed that there is no guarantee that she would be a candidate for opioid analgesics. The decision will be made following CDC guidelines. This decision will be based on the results of diagnostic studies, as well as Stacey Morris's risk profile.   Membrane stabilizer: To be determined at a later time  Muscle relaxant: To be determined at a later time  NSAID: To be determined at a later time  Other analgesic(s): To be determined at a  later time   Interventional management options: Stacey Morris was informed that there is no guarantee that she would be a candidate for interventional therapies. The decision will be based on the results of diagnostic studies, as well as Stacey Morris's risk profile.  Procedure(s) under consideration:  Pending results of ordered studies. We need to keep in mind that for the left foot pain, capsulitis symptoms can be very similar to those of a neuroma.  Capsulitis is often caused by excessive weightbearing beneath the affected toe joint, but the following factors can increase the likelihood of inflammation: Extreme bunion deformity. A second toe that is longer than your first toe. An unstable foot arch. Tight calf muscles on your involved side. Imbalance between the muscles on top of and below your feet (extensors and flexors). Regular use of footwear with an elevated heel, tapered toe box and/or toe-spring.     Interventional Therapies  Risk  Complexity Considerations:   Estimated body mass index is 23.32 kg/m as calculated from the following:   Height as of this encounter: 5' 4.5" (1.638 m).   Weight as of this encounter: 138 lb (62.6 kg). WNL   Planned  Pending:   Pending further evaluation   Under consideration:   Evaluate for capsulitis. Diagnostic left lumbar sympathetic block    Completed:   None at this time   Therapeutic  Palliative (PRN) options:   None established    Provider-requested follow-up: Return for (62mn), Eval-day (M,W), (F2F), 2nd Visit, for review of ordered tests.  Future Appointments  Date Time Provider [place] 06/05/2021  14:82PM Copland, ADeirdre Evener PA-C [facility] None  06/12/2021  1:40 PM NMilinda Pointer MD [facility] None    Note by: FGaspar Cola MD Date: 05/16/2021; Time: 12:09 PM

## 2021-05-16 ENCOUNTER — Ambulatory Visit: Payer: 59 | Attending: Pain Medicine | Admitting: Pain Medicine

## 2021-05-16 ENCOUNTER — Encounter: Payer: Self-pay | Admitting: Obstetrics and Gynecology

## 2021-05-16 ENCOUNTER — Encounter: Payer: Self-pay | Admitting: Pain Medicine

## 2021-05-16 ENCOUNTER — Other Ambulatory Visit: Payer: Self-pay

## 2021-05-16 VITALS — BP 141/108 | HR 132 | Temp 97.2°F | Resp 16 | Ht 64.5 in | Wt 138.0 lb

## 2021-05-16 DIAGNOSIS — M25561 Pain in right knee: Secondary | ICD-10-CM | POA: Insufficient documentation

## 2021-05-16 DIAGNOSIS — M25512 Pain in left shoulder: Secondary | ICD-10-CM | POA: Diagnosis present

## 2021-05-16 DIAGNOSIS — G4486 Cervicogenic headache: Secondary | ICD-10-CM | POA: Insufficient documentation

## 2021-05-16 DIAGNOSIS — M545 Low back pain, unspecified: Secondary | ICD-10-CM | POA: Diagnosis present

## 2021-05-16 DIAGNOSIS — G8929 Other chronic pain: Secondary | ICD-10-CM | POA: Insufficient documentation

## 2021-05-16 DIAGNOSIS — M7742 Metatarsalgia, left foot: Secondary | ICD-10-CM | POA: Insufficient documentation

## 2021-05-16 DIAGNOSIS — Z789 Other specified health status: Secondary | ICD-10-CM | POA: Diagnosis present

## 2021-05-16 DIAGNOSIS — M542 Cervicalgia: Secondary | ICD-10-CM | POA: Insufficient documentation

## 2021-05-16 DIAGNOSIS — M659 Synovitis and tenosynovitis, unspecified: Secondary | ICD-10-CM | POA: Diagnosis present

## 2021-05-16 DIAGNOSIS — R519 Headache, unspecified: Secondary | ICD-10-CM | POA: Diagnosis present

## 2021-05-16 DIAGNOSIS — M25511 Pain in right shoulder: Secondary | ICD-10-CM | POA: Insufficient documentation

## 2021-05-16 DIAGNOSIS — Z79899 Other long term (current) drug therapy: Secondary | ICD-10-CM | POA: Insufficient documentation

## 2021-05-16 DIAGNOSIS — M899 Disorder of bone, unspecified: Secondary | ICD-10-CM | POA: Diagnosis present

## 2021-05-16 DIAGNOSIS — M25562 Pain in left knee: Secondary | ICD-10-CM

## 2021-05-16 DIAGNOSIS — M79605 Pain in left leg: Secondary | ICD-10-CM | POA: Diagnosis present

## 2021-05-16 DIAGNOSIS — G608 Other hereditary and idiopathic neuropathies: Secondary | ICD-10-CM | POA: Insufficient documentation

## 2021-05-16 DIAGNOSIS — M5481 Occipital neuralgia: Secondary | ICD-10-CM | POA: Insufficient documentation

## 2021-05-16 DIAGNOSIS — M79672 Pain in left foot: Secondary | ICD-10-CM | POA: Diagnosis present

## 2021-05-16 DIAGNOSIS — G5601 Carpal tunnel syndrome, right upper limb: Secondary | ICD-10-CM | POA: Insufficient documentation

## 2021-05-16 DIAGNOSIS — G894 Chronic pain syndrome: Secondary | ICD-10-CM | POA: Diagnosis present

## 2021-05-16 NOTE — Progress Notes (Signed)
Safety precautions to be maintained throughout the outpatient stay will include: orient to surroundings, keep bed in low position, maintain call bell within reach at all times, provide assistance with transfer out of bed and ambulation.  

## 2021-05-17 ENCOUNTER — Other Ambulatory Visit: Payer: Self-pay

## 2021-05-17 ENCOUNTER — Ambulatory Visit
Admission: RE | Admit: 2021-05-17 | Discharge: 2021-05-17 | Disposition: A | Discharge status: Home / Self Care | Payer: 59 | Source: Ambulatory Visit | Attending: Pain Medicine | Admitting: Pain Medicine

## 2021-05-17 DIAGNOSIS — G8929 Other chronic pain: Secondary | ICD-10-CM

## 2021-05-17 DIAGNOSIS — M79605 Pain in left leg: Secondary | ICD-10-CM | POA: Insufficient documentation

## 2021-05-17 DIAGNOSIS — M545 Low back pain, unspecified: Secondary | ICD-10-CM | POA: Diagnosis present

## 2021-05-17 DIAGNOSIS — M25512 Pain in left shoulder: Secondary | ICD-10-CM | POA: Diagnosis present

## 2021-05-17 DIAGNOSIS — G4486 Cervicogenic headache: Secondary | ICD-10-CM

## 2021-05-17 DIAGNOSIS — M542 Cervicalgia: Secondary | ICD-10-CM | POA: Diagnosis present

## 2021-05-17 DIAGNOSIS — M79672 Pain in left foot: Secondary | ICD-10-CM

## 2021-05-17 DIAGNOSIS — M25511 Pain in right shoulder: Secondary | ICD-10-CM | POA: Insufficient documentation

## 2021-05-17 DIAGNOSIS — M25562 Pain in left knee: Secondary | ICD-10-CM | POA: Insufficient documentation

## 2021-05-17 DIAGNOSIS — R519 Headache, unspecified: Secondary | ICD-10-CM | POA: Diagnosis present

## 2021-05-17 DIAGNOSIS — M25561 Pain in right knee: Secondary | ICD-10-CM | POA: Diagnosis present

## 2021-05-17 DIAGNOSIS — M5481 Occipital neuralgia: Secondary | ICD-10-CM | POA: Diagnosis present

## 2021-05-18 DIAGNOSIS — G608 Other hereditary and idiopathic neuropathies: Secondary | ICD-10-CM | POA: Insufficient documentation

## 2021-05-18 DIAGNOSIS — M659 Synovitis and tenosynovitis, unspecified: Secondary | ICD-10-CM | POA: Insufficient documentation

## 2021-05-18 DIAGNOSIS — M65972 Unspecified synovitis and tenosynovitis, left ankle and foot: Secondary | ICD-10-CM | POA: Insufficient documentation

## 2021-05-18 DIAGNOSIS — M7742 Metatarsalgia, left foot: Secondary | ICD-10-CM | POA: Insufficient documentation

## 2021-05-22 LAB — COMP. METABOLIC PANEL (12)
AST: 9 IU/L (ref 0–40)
Albumin/Globulin Ratio: 1.9 (ref 1.2–2.2)
Albumin: 4.4 g/dL (ref 3.8–4.9)
Alkaline Phosphatase: 91 IU/L (ref 44–121)
BUN/Creatinine Ratio: 19 (ref 9–23)
BUN: 18 mg/dL (ref 6–24)
Bilirubin Total: 0.4 mg/dL (ref 0.0–1.2)
Calcium: 9.9 mg/dL (ref 8.7–10.2)
Chloride: 99 mmol/L (ref 96–106)
Creatinine, Ser: 0.94 mg/dL (ref 0.57–1.00)
Globulin, Total: 2.3 g/dL (ref 1.5–4.5)
Glucose: 96 mg/dL (ref 70–99)
Potassium: 4.2 mmol/L (ref 3.5–5.2)
Sodium: 139 mmol/L (ref 134–144)
Total Protein: 6.7 g/dL (ref 6.0–8.5)
eGFR: 73 mL/min/{1.73_m2} (ref >59)

## 2021-05-22 LAB — 25-HYDROXY VITAMIN D LCMS D2+D3
25-Hydroxy, Vitamin D-2: 21 ng/mL
25-Hydroxy, Vitamin D-3: 8.1 ng/mL
25-Hydroxy, Vitamin D: 29 ng/mL — ABNORMAL LOW

## 2021-05-22 LAB — MAGNESIUM: Magnesium: 2.4 mg/dL — ABNORMAL HIGH (ref 1.6–2.3)

## 2021-05-22 LAB — C-REACTIVE PROTEIN: CRP: 3 mg/L (ref 0–10)

## 2021-05-22 LAB — COMPLIANCE DRUG ANALYSIS, UR

## 2021-05-22 LAB — VITAMIN B12: Vitamin B-12: 824 pg/mL (ref 232–1245)

## 2021-05-22 LAB — SEDIMENTATION RATE: Sed Rate: 26 mm/hr (ref 0–40)

## 2021-05-24 ENCOUNTER — Other Ambulatory Visit: Payer: Self-pay | Admitting: Obstetrics and Gynecology

## 2021-05-24 DIAGNOSIS — Z1231 Encounter for screening mammogram for malignant neoplasm of breast: Secondary | ICD-10-CM

## 2021-05-24 NOTE — Progress Notes (Signed)
Mammo order. Was getting them at [facility], no longer take her ins.

## 2021-06-05 ENCOUNTER — Other Ambulatory Visit: Payer: Self-pay

## 2021-06-05 ENCOUNTER — Other Ambulatory Visit (HOSPITAL_COMMUNITY)
Admission: RE | Admit: 2021-06-05 | Discharge: 2021-06-05 | Disposition: A | Discharge status: Home / Self Care | Payer: 59 | Source: Ambulatory Visit | Attending: Obstetrics and Gynecology | Admitting: Obstetrics and Gynecology

## 2021-06-05 ENCOUNTER — Encounter: Payer: Self-pay | Admitting: Obstetrics and Gynecology

## 2021-06-05 ENCOUNTER — Ambulatory Visit (INDEPENDENT_AMBULATORY_CARE_PROVIDER_SITE_OTHER): Payer: 59 | Admitting: Obstetrics and Gynecology

## 2021-06-05 VITALS — BP 110/70 | Ht 64.0 in | Wt 139.0 lb

## 2021-06-05 DIAGNOSIS — Z87410 Personal history of cervical dysplasia: Secondary | ICD-10-CM

## 2021-06-05 DIAGNOSIS — N951 Menopausal and female climacteric states: Secondary | ICD-10-CM

## 2021-06-05 DIAGNOSIS — Z01419 Encounter for gynecological examination (general) (routine) without abnormal findings: Secondary | ICD-10-CM

## 2021-06-05 DIAGNOSIS — Z1211 Encounter for screening for malignant neoplasm of colon: Secondary | ICD-10-CM

## 2021-06-05 DIAGNOSIS — Z803 Family history of malignant neoplasm of breast: Secondary | ICD-10-CM | POA: Insufficient documentation

## 2021-06-05 DIAGNOSIS — Z3041 Encounter for surveillance of contraceptive pills: Secondary | ICD-10-CM

## 2021-06-05 DIAGNOSIS — Z1151 Encounter for screening for human papillomavirus (HPV): Secondary | ICD-10-CM | POA: Insufficient documentation

## 2021-06-05 DIAGNOSIS — Z124 Encounter for screening for malignant neoplasm of cervix: Secondary | ICD-10-CM | POA: Diagnosis present

## 2021-06-05 DIAGNOSIS — Z9189 Other specified personal risk factors, not elsewhere classified: Secondary | ICD-10-CM | POA: Insufficient documentation

## 2021-06-05 DIAGNOSIS — Z1231 Encounter for screening mammogram for malignant neoplasm of breast: Secondary | ICD-10-CM

## 2021-06-05 MED ORDER — NORETHINDRONE ACET-ETHINYL EST 1-20 MG-MCG PO TABS: ORAL_TABLET | ORAL | 3 refills | Status: DC

## 2021-06-05 NOTE — Patient Instructions (Signed)
I value your feedback and you entrusting us with your care. If you get a [facility] patient survey, I would appreciate you taking the time to let us know about your experience today. Thank you! ? ? ?

## 2021-06-05 NOTE — Progress Notes (Signed)
PCP: Tracie Harrier, MD   Chief Complaint  Patient presents with   Gynecologic Exam    HPI:      [name] is a 50 y.o. [phone] whose LMP was Patient's last menstrual period was 05/10/2021 (exact date)., presents today for her annual examination.  Her menses are Q3 months with cont dosing of OCPs, lasting 5 days lighter flow LMP but more mod flow previous periods. Changed to 20 mcg pill last yr, doing well. Dysmenorrhea mild to moderate, sometimes not relieved with NSAIDs/heating pad. She does not have intermenstrual bleeding.  Ultrasound 2019 revealed SS and IM fibroids, the largest measured was 3.5 x 2.7 cm.  Having significant vasomotor sx with full body sweating. Under increased stress and now has chronic pain syndrome in foot. Hx of worsening vasomotor sx since 4/20 (same time as husband passed away). Normal TSH 10-14-2022.  Sex activity: not sexually active. She does not have vaginal dryness.  Last Pap: 02/25/19  Results were: no abnormalities /neg HPV DNA 2019  Hx of STDs: HSV; HPV on pap, s/p LEEP 2010  Last mammogram: 10/13/19 at [facility];  Results were: normal--routine follow-up in 12 months. Has appt 11/22 There is a FH of breast cancer in her pat aunt and mat aunt. There is a FH of ovarian cancer in her mat aunt. Also kidney cancer in her dad and PGM. The patient does do self-breast exams. Pt is MyRisk neg 5/21 except MSH6 VUS; IBIS=15.2%/riskscore=21.4%  Colonoscopy: never, wants cologuard, order expired last yr; wants to do this yr.  Tobacco use: The patient denies current or previous tobacco use. Alcohol use: none  No drug use Exercise: min active now due to foot injury  She does get adequate calcium but not Vitamin D in her diet. Hx of Vit D deficiency and had normal labs 2022-10-14, slightly low 9/22.  Labs with PCP.   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   Allergy    Asthma    BRCA negative  04/2020   MyRisk neg except MSH6 VUS   Complex regional pain syndrome I    Family history of breast cancer    Family history of kidney cancer    Family history of ovarian cancer    Genital herpes    Genital warts    Hematuria    Increased risk of breast cancer 04/2020   IBIS=15.2%/riskscore=21.4%   Vitamin D deficiency     Past Surgical History:  Procedure Laterality Date   CERVICAL BIOPSY  W/ LOOP ELECTRODE EXCISION  2010   LGSIL benign pathology    Family History  Problem Relation Age of Onset   Liver cancer Mother 68   Diabetes Father    Sarcoidosis Sister    Ovarian cancer Maternal Aunt    Breast cancer Paternal Aunt 74    Social History   Socioeconomic History   Marital status: Widowed    Spouse name: Not on file   Number of children: 3   Years of education: Not on file   Highest education level: Not on file  Occupational History   Occupation: Glass blower/designer  Tobacco Use   Smoking status: Never   Smokeless tobacco: Never  Vaping Use   Vaping Use: Never used  Substance and Sexual Activity   Alcohol use: Yes    Alcohol/week: 0.0 standard drinks    Comment: occasional   Drug use: No  Sexual activity: Not Currently    Partners: Male    Birth control/protection: Pill  Other Topics Concern   Not on file  Social History Narrative   Not on file   Social Determinants of Health   Financial Resource Strain: Not on file  Food Insecurity: Not on file  Transportation Needs: Not on file  Physical Activity: Not on file  Stress: Not on file  Social Connections: Not on file  Intimate Partner Violence: Not on file     Current Outpatient Medications:    ALBUTEROL SULFATE HFA IN, Inhale into the lungs., Disp: , Rfl:    diphenhydrAMINE (BENADRYL) 25 MG tablet, Take 25 mg by mouth as needed., Disp: , Rfl:    Fluticasone Propionate (XHANCE) 93 MCG/ACT EXHU, , Disp: , Rfl:    HYDROcodone-acetaminophen (NORCO/VICODIN) 5-325 MG tablet, Take by mouth., Disp: , Rfl:     montelukast (SINGULAIR) 10 MG tablet, Take by mouth., Disp: , Rfl:    nortriptyline (PAMELOR) 10 MG capsule, Take 10 mg by mouth at bedtime., Disp: , Rfl:    SYMBICORT 160-4.5 MCG/ACT inhaler, SMARTSIG:2 Inhalation Via Inhaler Twice Daily, Disp: , Rfl:    tiZANidine (ZANAFLEX) 2 MG tablet, TAKE 1-2 TABLETS BY MOUTH THREE TIMES A DAY AS NEEDED FOR MUSCLE SPASM, Disp: , Rfl:    traMADol (ULTRAM) 50 MG tablet, Take 50 mg by mouth every 6 (six) hours as needed., Disp: , Rfl:    lidocaine-prilocaine (EMLA) cream, Apply 1 application topically as needed. (Patient not taking: Reported on 06/05/2021), Disp: 30 g, Rfl: 0   mirtazapine (REMERON) 7.5 MG tablet, Take by mouth., Disp: , Rfl:    norethindrone-ethinyl estradiol (JUNEL 1/20) 1-20 MG-MCG tablet, Take 1 tablet daily continuous dosing, Disp: 84 tablet, Rfl: 3   zolpidem (AMBIEN) 5 MG tablet, Take by mouth., Disp: , Rfl:      ROS:  Review of Systems  Constitutional:  Positive for fatigue. Negative for fever and unexpected weight change.  Respiratory:  Negative for cough, shortness of breath and wheezing.   Cardiovascular:  Negative for chest pain, palpitations and leg swelling.  Gastrointestinal:  Negative for blood in stool, constipation, diarrhea, nausea and vomiting.  Endocrine: Negative for cold intolerance, heat intolerance and polyuria.  Genitourinary:  Negative for dyspareunia, dysuria, flank pain, frequency, genital sores, hematuria, menstrual problem, pelvic pain, urgency, vaginal bleeding, vaginal discharge and vaginal pain.  Musculoskeletal:  Positive for arthralgias. Negative for back pain, joint swelling and myalgias.  Skin:  Negative for rash.  Neurological:  Positive for numbness. Negative for dizziness, syncope, light-headedness and headaches.  Hematological:  Negative for adenopathy.  Psychiatric/Behavioral:  Negative for agitation, confusion, sleep disturbance and suicidal ideas. The patient is not nervous/anxious.    BREAST: No symptoms    Objective: BP 110/70   Ht '5\' 4"'  (1.626 m)   Wt 139 lb (63 kg)   LMP 05/10/2021 (Exact Date)   BMI 23.86 kg/m    Physical Exam Constitutional:      Appearance: She is well-developed.  Genitourinary:     Vulva normal.     Right Labia: No rash, tenderness or lesions.    Left Labia: No tenderness, lesions or rash.    No vaginal discharge, erythema or tenderness.      Right Adnexa: not tender and no mass present.    Left Adnexa: not tender and no mass present.    No cervical friability or polyp.     Uterus is not enlarged or tender.  Breasts:  Right: No mass, nipple discharge, skin change or tenderness.     Left: No mass, nipple discharge, skin change or tenderness.  Neck:     Thyroid: No thyromegaly.  Cardiovascular:     Rate and Rhythm: Normal rate and regular rhythm.     Heart sounds: Normal heart sounds. No murmur heard. Pulmonary:     Effort: Pulmonary effort is normal.     Breath sounds: Normal breath sounds.  Abdominal:     Palpations: Abdomen is soft.     Tenderness: There is no abdominal tenderness. There is no guarding or rebound.  Musculoskeletal:        General: Normal range of motion.     Cervical back: Normal range of motion.  Lymphadenopathy:     Cervical: No cervical adenopathy.  Neurological:     General: No focal deficit present.     Mental Status: She is alert and oriented to person, place, and time.     Cranial Nerves: No cranial nerve deficit.  Skin:    General: Skin is warm and dry.  Psychiatric:        Mood and Affect: Mood normal.        Behavior: Behavior normal.        Thought Content: Thought content normal.        Judgment: Judgment normal.  Vitals reviewed.    Assessment/Plan: Encounter for annual routine gynecological examination  Cervical cancer screening - Plan: Cytology - PAP  Screening for HPV (human papillomavirus) - Plan: Cytology - PAP  History of cervical dysplasia - Plan: Cytology -  PAP  Encounter for surveillance of contraceptive pills - Plan: norethindrone-ethinyl estradiol (JUNEL 1/20) 1-20 MG-MCG tablet; OCP RF. Will eval menses next yr.   Encounter for screening mammogram for malignant neoplasm of breast; pt has mammo sched.   Family history of breast cancer--Pt is MyRisk neg  Increased risk of breast cancer--pt aware of monthly SBE, yearly CBE and mammos, resume Vit D supp  Perimenopausal vasomotor symptoms--on OCPs. Question related to stress/pain syndrome now. Doubt is all hormonal given dose of hormones on cont dosing of OCPs.  Screening for colon cancer - Plan: Cologuard; colonoscopy/cologuard discussed. Pt elects cologuard. Ref sent. Will f/u with results.   Meds ordered this encounter  Medications   norethindrone-ethinyl estradiol (JUNEL 1/20) 1-20 MG-MCG tablet    Sig: Take 1 tablet daily continuous dosing    Dispense:  84 tablet    Refill:  3    Order Specific Question:   Supervising Provider    Answer:   Gae Dry [[id number]]            GYN counsel breast self exam, mammography screening, menopause, adequate intake of calcium and vitamin D, diet and exercise    F/U  Return in about 1 year (around 06/05/2022).  Gustave Lindeman B. Markita Stcharles, PA-C 06/05/2021 4:55 PM

## 2021-06-08 LAB — CYTOLOGY - PAP
Comment: NEGATIVE
Diagnosis: NEGATIVE
High risk HPV: NEGATIVE

## 2021-06-12 ENCOUNTER — Ambulatory Visit: Payer: 59 | Admitting: Pain Medicine

## 2021-06-21 ENCOUNTER — Other Ambulatory Visit: Payer: Self-pay

## 2021-06-21 ENCOUNTER — Ambulatory Visit
Admission: RE | Admit: 2021-06-21 | Discharge: 2021-06-21 | Disposition: A | Discharge status: Home / Self Care | Payer: 59 | Source: Ambulatory Visit | Attending: Obstetrics and Gynecology | Admitting: Obstetrics and Gynecology

## 2021-06-21 DIAGNOSIS — Z1231 Encounter for screening mammogram for malignant neoplasm of breast: Secondary | ICD-10-CM

## 2021-07-19 DIAGNOSIS — Z1211 Encounter for screening for malignant neoplasm of colon: Secondary | ICD-10-CM

## 2021-07-19 HISTORY — DX: Encounter for screening for malignant neoplasm of colon: Z12.11

## 2021-08-01 ENCOUNTER — Telehealth: Payer: Self-pay | Admitting: Urology

## 2021-08-01 NOTE — Telephone Encounter (Signed)
Please have Stacey Morris schedule an appointment as she missed her 1 in June and continues to have microscopic blood in her urine and we need to run more tests.

## 2021-08-01 NOTE — Telephone Encounter (Signed)
Left message for pt to call in to be scheduled.

## 2021-08-06 ENCOUNTER — Telehealth: Payer: Self-pay

## 2021-08-06 NOTE — Telephone Encounter (Signed)
Called pt to f/u on Cologuard test, is she still planning to complete test? Pt said yes, she was waiting for insurance coverage confirmation before sending it out. She just heard back last week, plans to complete it this week.

## 2021-08-07 NOTE — Telephone Encounter (Signed)
Pt is starting a new job in January and will call back to reschedule this appt when her training is complete.

## 2021-08-14 NOTE — Progress Notes (Signed)
PROVIDER NOTE: Information contained herein reflects review and annotations entered in association with encounter. Interpretation of such information and data should be left to medically-trained personnel. Information provided to patient can be located elsewhere in the medical record under "Patient Instructions". Document created using STT-dictation technology, any transcriptional errors that may result from process are unintentional.    Patient: Stacey Morris  Service Category: E/M  Provider: Gaspar Cola, MD  DOB: 1971-01-17  DOS: 08/15/2021  Specialty: Interventional Pain Management  MRN: [id number]  Setting: Ambulatory outpatient  PCP: Tracie Harrier, MD  Type: Established Patient    Referring Provider: Tracie Harrier, MD  Location: Office  Delivery: Face-to-face     Primary Reason(s) for Visit: Encounter for evaluation before starting new chronic pain management plan of care (Level of risk: moderate) CC: Foot Pain (left), Back Pain (lower), and Knee Pain  HPI  Stacey Morris is a 50 y.o. year old, female patient, who comes today for a follow-up evaluation to review the test results and decide on a treatment plan. She has Genital herpes; Asthma; Allergies; Abnormal Pap smear of cervix; Intramural and subserous leiomyoma of uterus; Vitamin D insufficiency; Family history of ovarian cancer; Asthma, stable, moderate persistent; Chronic low back pain (4th area of Pain) (Bilateral) (R>L) w/o sciatica; DDD (degenerative disc disease), lumbosacral; Family history of kidney cancer; Hematuria, microscopic; Insomnia; Low serum vitamin D; Lumbar radiculopathy; Major depressive disorder, recurrent, in partial remission ([place]); Seasonal allergic rhinitis; Asthma, stable, mild intermittent; Complex regional pain syndrome i of left lower limb; H/O degenerative disc disease; RAD (reactive airway disease), moderate persistent, uncomplicated; Chronic pain syndrome; Pharmacologic therapy; Disorder of skeletal  system; Problems influencing health status; Chronic foot pain (1ry area of Pain) (Left); Chronic lower extremity pain (2ry area of Pain) (Left); Chronic knee pain (3ry area of Pain) (Bilateral) (L>R); Chronic neck pain (5th area of Pain) (Midline); Cervicogenic headache; Occipital headache (Bilateral); Greater occipital neuralgia (Bilateral); Chronic shoulder pain (6th area of Pain) (Bilateral); Carpal tunnel syndrome (Right); Metatarsalgia of foot (Left); Tenosynovitis of ankle (Left); 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 Her primarily concern today is the Foot Pain (left), Back Pain (lower), and Knee Pain  Pain Assessment: Location: Left Foot Radiating: effects great toe and toe beside, inside of foot Onset: More than a month ago Duration: Chronic pain Quality: Aching, Constant, Discomfort, Shooting, Throbbing Severity: 4 /10 (subjective, self-reported pain score)  Effect on ADL: putting on shoes or any pressure on foot, prolonged walking or standing Timing: Constant Modifying factors: Nothing BP: (!) 148/103   HR: (!) 135  Stacey Morris comes in today for a follow-up visit after her initial evaluation on 05/16/2021. Today we went over the results of her tests. These were explained in "Layman's terms". During today's appointment we went over my diagnostic impression, as well as the proposed treatment plan.  Review of initial evaluation: "According to the patient the primary area of pain is that of the left foot.  She denies any surgeries or physical therapy but she admits to having had some injections done as well as some x-rays.  The patient has an MRI of the left foot done on 02/20/2021 with evidence of a mild focal posterior tibial tendon tenosynovitis above the  medial malleolus as well as a somewhat diminutive appearance of the inframalleolar peroneal brevis tendon, with  no other findings to explain her neuropathic pain.  Specifically the MRI indicates "no evidence of intermetatarsal neuroma or bursitis.".  She indicates having a diagnosis or history of [place] fasciitis and was told that she has a complex regional pain syndrome.  The patient has already been seen by an orthopedic surgeon at [facility], and has a neurologist.  Even though her allergy list includes gabapentin and prednisone, this does not seem to be accurate since she recently received a prednisone taper.  Apparently its not an allergy, but an intolerance were it seems to exacerbate her depression.  Her history of depression seems to have been tied to her husband's death.  The patient describes the location of the pain as being over the top of her left foot and her calf area were he feels as if somebody was pulling that area.  The topicals a possible "tarsal tunnel syndrome" has apparently, before.  She describes the pain to be in the foot and ankle and she is unable to tolerate cold air.  She describes apparently having allodynia over the portion of the foot.  However, the pattern is not necessarily dermatomal or "stocking-like/boot like".  She describes having significant sensitivity in her left foot with a "feeling as if she was walking on marbles".  She also describes having been told in the past that she has degenerative disc disease.  She apparently was also given a tentative diagnosis of "complex regional pain syndrome" and she was started on "scramble therapy".  She indicated that this therapy was given to her by Dr. Manuella Ghazi in his office and it consisted of 12 days of treatment.  When this was started, according to her a cost all of her other pains to flareup.  By this she indicated having had a flareup of pain in her scalp, shoulder, lower back, arms, as well as tingling in her head.  Some of these  have stopped and decreased since the "scramble therapy" was terminated.  The patient also describes that the pain in her foot started on the first week of June 2022, "out of the blue".  She refers that the only thing that she remembers is having dropped a bottle of moisturizer onto her left foot, but this happened several months before the pain started and did not seem to cause any major problems immediately after it happened.  Therefore her current cause of this seems to be unknown.  She describes the exact location of the pain to be in the "ball of the foot", and the area usually affected by metatarsalgia, a condition typically seen among athletes due to overuse, but it can also be associated with certain food wear (poorly fitted shoes with a narrow toe box) or foot deformities (hammertoes, bunions, or high arches) or other medical conditions such as Morton's neuroma or capsulitis.  The patient indicates having seen a podiatrist (Dr. Luana Shu at [facility]).          The patient is secondary area pain is that of the left lower extremity described to be from the foot to the knee with no history of surgeries, nerve blocks, recent x-rays, or physical therapy.  Review of medical records indicated the patient to have had a nerve conduction test done on 06/30/2019 shown to be an abnormal electrodiagnostic study consistent with generalized sensory polyneuropathy with no evidence at that time of any superimposed lumbosacral radiculopathy.   The patient's third area pain is that of the knees (Bilateral) (L>R).  She denies any surgeries, recent  x-rays, injections, physical therapy.   The patient's fourth area pain is that of the lower back (Bilateral) (R>L).  The patient denies any surgeries, physical therapy or injection therapy.  She later clarified having had some physical therapy many years ago but it did not help and in fact it worsened her low back pain.  The patient indicated having had some x-rays.  The  patient also indicated that the low back pain used to referred pain towards the right buttocks area.   The patient is fifth area pain is that of the neck (posterior aspect) (Midline) with decreased range of motion.  She denies any prior surgeries, nerve blocks, or physical therapy.  She does indicate having had some x-rays in the past.   The patient's sixth area pain is that of the shoulder (Bilateral).  She indicated that this pain started when she began having treatments to "scramble her nerves" (?).  She denies any surgeries, nerve blocks, physical therapy, or x-rays.   The patient's seventh area pain is that of the top of the head, associated with occipital headaches (Bilateral) and what appears to be the distribution of the greater occipital nerve (Bilateral).  The patient denies any surgeries, x-rays, nerve blocks, or physical therapy.   The patient's eighth area pain is that of the right hand and wrist where she indicates having been diagnosed with a right sided carpal tunnel syndrome.  She also indicated having had a nerve block by Dr. Manuella Ghazi, which did not help the pain and it made it worse.  Review of the medical record indicate that the patient had a nerve conductions test done on 06/29/2019 read to be an abnormal electrodiagnostic exam consistent with a right minimal (grade 1) carpal tunnel syndrome (median nerve entrapment at wrist).  Review of Dr. Trena Platt notes on 07/23/2019 indicate date of service he performed a right median nerve block with ultrasound guidance where he injected 0.5 mL of 2% lidocaine (20 mg/mL) + 0.5 mL of triamcinolone acetonide (40 mg/mL) (for a total of 1 mL).   Pharmacotherapy: The patient has been prescribed pregabalin (Lyrica 25 mg capsule with instructions to take 1 twice daily (2022).  In addition she has been prescribed Ambien 5 mg, 1 tab p.o. at bedtime for insomnia (2021).  Tizanidine (Zanaflex) 2 mg, 1 to 2 tablets by mouth 3 times daily for muscle spasms (2021);  lidocaine (LMX) 4% cream, apply topically to times daily (2021).  Most recently the patient was given a prednisone taper at [facility]."  Today I went over the results of the lab work and x-rays with the patient and I have explained those to her in layman's terms.  I have also explained to the patient that based on my findings, I would recommend for her to go back to her podiatrist for further consideration on the possibility of treating the posterior tibial tendon tenosynovitis, above the medial malleolus.  The MRI indicates that there is no evidence of intermetatarsal neuroma or bursitis and that there are no cystic or solid masses.  The patient was also recommended to consider following up with an orthopedic surgeon that specializes in foot and ankle size to perhaps look at some possible alternatives and how to treat this.  Today I have offered the patient to inject this area where she has been experiencing the pain to see if by any chance she finds some benefit on that.  However, I was clear to her that although I was willing to do this for her,  there is no guarantee that it would help in any way.  She understood and accepted.  She indicated that she would have to think about it.  Controlled Substance Pharmacotherapy Assessment REMS (Risk Evaluation and Mitigation Strategy)  Opioid Analgesic: .   None MME/day: 0 mg/day  Pill Count: None expected due to no prior prescriptions written by our practice. Ignatius Specking, RN  08/15/2021  3:03 PM  Sign when Signing Visit Safety precautions to be maintained throughout the outpatient stay will include: orient to surroundings, keep bed in low position, maintain call bell within reach at all times, provide assistance with transfer out of bed and ambulation.    Pharmacokinetics: Liberation and absorption (onset of action): WNL Distribution (time to peak effect): WNL Metabolism and excretion (duration of action): WNL         Pharmacodynamics: Desired  effects: Analgesia: Ms. Marston reports >50% benefit. Functional ability: Patient reports that medication allows her to accomplish basic ADLs Clinically meaningful improvement in function (CMIF): Sustained CMIF goals met Perceived effectiveness: Described as relatively effective, allowing for increase in activities of daily living (ADL) Undesirable effects: Side-effects or Adverse reactions: None reported Monitoring: [place] PMP: PDMP reviewed during this encounter. Online review of the past 55-monthperiod previously conducted. Not applicable at this point since we have not taken over the patient's medication management yet. List of other Serum/Urine Drug Screening Test(s):  No results found for: AMPHSCRSER, BARBSCRSER, BENZOSCRSER, COCAINSCRSER, COCAINSCRNUR, PCPSCRSER, THCSCRSER, THCU, CANNABQUANT, [place]DS test(s[place] done:  Lab Results  Component Value Date   SUMMARY Note 05/16/2021   Last UDS on record: Summary  Date Value Ref Range Status  05/16/2021 Note  Final    Comment:    ==================================================================== Compliance Drug Analysis, Ur ==================================================================== Test                             Result       Flag       Units  Drug Present and Declared for Prescription Verification   Zolpidem                       PRESENT      EXPECTED   Zolpidem Acid                  PRESENT      EXPECTED    Zolpidem acid is an expected metabolite of zolpidem.    Nortriptyline                  PRESENT      EXPECTED    Nortriptyline may be administered as a prescription drug; it is also    an expected metabolite of amitriptyline.  Drug Absent but Declared for Prescription Verification   Tramadol                       Not Detected UNEXPECTED ng/mg creat   Tizanidine                     Not Detected UNEXPECTED    Tizanidine, as indicated in the declared medication list, is  not    always detected even when used as directed.    Mirtazapine                    Not Detected UNEXPECTED   Diphenhydramine  Not Detected UNEXPECTED   Lidocaine                      Not Detected UNEXPECTED    Lidocaine, as indicated in the declared medication list, is not    always detected even when used as directed.  ==================================================================== Test                      Result    Flag   Units      Ref Range   Creatinine              187              mg/dL      >=20 ==================================================================== Declared Medications:  The flagging and interpretation on this report are based on the  following declared medications.  Unexpected results may arise from  inaccuracies in the declared medications.   **Note: The testing scope of this panel includes these medications:   Diphenhydramine (Benadryl)  Mirtazapine (Remeron)  Nortriptyline (Pamelor)  Tramadol (Ultram)   **Note: The testing scope of this panel does not include small to  moderate amounts of these reported medications:   Tizanidine (Zanaflex)  Topical Lidocaine (EMLA)  Zolpidem (Ambien)   **Note: The testing scope of this panel does not include the  following reported medications:   Albuterol  Ethinyl Estradiol (Junel)  Ethinyl Estradiol  Montelukast (Singulair)  Norethindrone (Junel)  Norethindrone  Prednisone  Prilocaine (EMLA) ==================================================================== For clinical consultation, please call [phone]. ====================================================================    UDS interpretation: No unexpected findings.          Medication Assessment Form: Patient introduced to form today Treatment compliance: Treatment may start today if patient agrees with proposed plan. Evaluation of compliance is not applicable at this point Risk Assessment Profile: Aberrant behavior: See  initial evaluations. None observed or detected today Comorbid factors increasing risk of overdose: See initial evaluation. No additional risks detected today Opioid risk tool (ORT):  Opioid Risk  08/15/2021  Alcohol 0  Illegal Drugs 0  Rx Drugs 0  Alcohol 0  Illegal Drugs 0  Rx Drugs 0  Psychological Disease 0  ADD -  OCD -  Bipolar -  Depression 0  Opioid Risk Tool Scoring 0  Opioid Risk Interpretation Low Risk    ORT Scoring interpretation table:  Score <3 = Low Risk for SUD  Score between 4-7 = Moderate Risk for SUD  Score >8 = High Risk for Opioid Abuse   Risk of substance use disorder (SUD): Low  Risk Mitigation Strategies:  Patient opioid safety counseling: Completed today. Counseling provided to patient as per "Patient Counseling Document". Document signed by patient, attesting to counseling and understanding Patient-Prescriber Agreement (PPA): Obtained today.  Controlled substance notification to other providers: Written and sent today.  Pharmacologic Plan: Non-opioid analgesic therapy offered. Interventional alternatives discussed.             Laboratory Chemistry Profile   Renal Lab Results  Component Value Date   BUN 18 05/16/2021   CREATININE 0.94 05/16/2021   BCR 19 05/16/2021   SPECGRAV 1.025 06/14/2020   PHUR 6.5 06/14/2020   PROTEINUR Negative 06/14/2020     Electrolytes Lab Results  Component Value Date   NA 139 05/16/2021   K 4.2 05/16/2021   CL 99 05/16/2021   CALCIUM 9.9 05/16/2021   MG 2.4 (H) 05/16/2021     Hepatic Lab Results  Component Value Date  AST 9 05/16/2021   ALBUMIN 4.4 05/16/2021   ALKPHOS 91 05/16/2021     ID Lab Results  Component Value Date   SARSCOV2NAA Not Detected 08/11/2019     Bone Lab Results  Component Value Date   25OHVITD1 29 (L) 05/16/2021   25OHVITD2 21 05/16/2021   25OHVITD3 8.1 05/16/2021     Endocrine Lab Results  Component Value Date   GLUCOSE 96 05/16/2021   GLUCOSEU Negative 06/14/2020      Neuropathy Lab Results  Component Value Date   VITAMINB12 824 05/16/2021   FOLATE 8.8 04/26/2020     CNS No results found for: COLORCSF, APPEARCSF, RBCCOUNTCSF, WBCCSF, POLYSCSF, LYMPHSCSF, EOSCSF, PROTEINCSF, GLUCCSF, JCVIRUS, CSFOLI, IGGCSF, LABACHR, ACETBL, LABACHR, ACETBL   Inflammation (CRP: Acute   ESR: Chronic) Lab Results  Component Value Date   CRP 3 05/16/2021   ESRSEDRATE 26 05/16/2021     Rheumatology No results found for: RF, ANA, LABURIC, URICUR, LYMEIGGIGMAB, LYMEABIGMQN, HLAB27   Coagulation No results found for: INR, LABPROT, APTT, PLT, DDIMER, LABHEMA, VITAMINK1, AT3   Cardiovascular No results found for: BNP, CKTOTAL, CKMB, TROPONINI, HGB, HCT, LABVMA, EPIRU, EPINEPH24HUR, NOREPRU, NOREPI24HUR, DOPARU, [id number]   Screening Lab Results  Component Value Date   SARSCOV2NAA Not Detected 08/11/2019     Cancer No results found for: CEA, CA125, LABCA2   Allergens No results found for: ALMOND, APPLE, ASPARAGUS, AVOCADO, BANANA, BARLEY, BASIL, BAYLEAF, GREENBEAN, LIMABEAN, WHITEBEAN, BEEFIGE, REDBEET, BLUEBERRY, BROCCOLI, CABBAGE, MELON, CARROT, CASEIN, CASHEWNUT, CAULIFLOWER, CELERY     Note: Lab results reviewed.  Recent Diagnostic Imaging Review  Cervical Imaging: Cervical DG Bending/F/E views: Results for orders placed during the hospital encounter of 05/17/21 DG Cervical Spine With Flex & Extend  Narrative CLINICAL DATA:  Neck pain.  No injury.  EXAM: CERVICAL SPINE COMPLETE WITH FLEXION AND EXTENSION VIEWS  COMPARISON:  None.  FINDINGS: Vertebral body alignment and heights are normal. There is mild spondylosis of the cervical spine most notable at the C5-6 level. Minimal disc space narrowing at the C5-6 level. Prevertebral soft tissues are normal. No instability on flexion and extension mild uncovertebral joint spurring worse at the C5-6 level. Atlantoaxial articulation is normal. No significant left-sided neural foraminal narrowing.  Suboptimal positioning for adequate evaluation of the right neural foramina.  IMPRESSION: Mild spondylosis of the cervical spine with disc disease at the C5-6 level. No instability on flexion and extension.   Electronically Signed By: Marin Olp M.D. On: 05/19/2021 10:34  Shoulder Imaging: Shoulder-R DG: Results for orders placed during the hospital encounter of 05/17/21 DG Shoulder Right  Narrative CLINICAL DATA:  Right shoulder pain.  No injury.  EXAM: RIGHT SHOULDER - 2+ VIEW  COMPARISON:  None.  FINDINGS: There is no evidence of fracture or dislocation. There is no evidence of arthropathy or other focal bone abnormality. Soft tissues are unremarkable.  IMPRESSION: Negative.   Electronically Signed By: Marin Olp M.D. On: 05/19/2021 10:40  Shoulder-L DG: Results for orders placed during the hospital encounter of 05/17/21 DG Shoulder Left  Narrative CLINICAL DATA:  Left shoulder pain.  No injury.  EXAM: LEFT SHOULDER - 2+ VIEW  COMPARISON:  None.  FINDINGS: There is no evidence of fracture or dislocation. There is no evidence of arthropathy or other focal bone abnormality. Soft tissues are unremarkable.  IMPRESSION: Negative.   Electronically Signed By: Marin Olp M.D. On: 05/19/2021 10:40  Knee Imaging: Knee-R DG 4 views: Results for orders placed during the hospital encounter of 05/17/21 DG Knee  Complete 4 Views Right  Narrative CLINICAL DATA:  Right knee pain.  No injury.  EXAM: RIGHT KNEE - COMPLETE 4+ VIEW  COMPARISON:  None.  FINDINGS: No evidence of fracture, dislocation, or joint effusion. No evidence of arthropathy or other focal bone abnormality. Soft tissues are unremarkable.  IMPRESSION: Negative.   Electronically Signed By: Marin Olp M.D. On: 05/19/2021 10:42  Knee-L DG 4 views: Results for orders placed during the hospital encounter of 05/17/21 DG Knee Complete 4 Views Left  Narrative CLINICAL DATA:   Left knee pain.  No injury.  EXAM: LEFT KNEE - COMPLETE 4+ VIEW  COMPARISON:  None.  FINDINGS: No evidence of fracture, dislocation, or joint effusion. No evidence of arthropathy or other focal bone abnormality. Soft tissues are unremarkable.  IMPRESSION: Negative.   Electronically Signed By: Marin Olp M.D. On: 05/19/2021 10:43  Ankle Imaging: Ankle-L DG Complete: Results for orders placed during the hospital encounter of 05/17/21 DG Ankle Complete Left  Narrative CLINICAL DATA:  Left ankle pain persistent laterally after blunt trauma April 2022.  EXAM: LEFT ANKLE COMPLETE - 3+ VIEW  COMPARISON:  None.  FINDINGS: There is no evidence of fracture, dislocation, or joint effusion. There is no evidence of arthropathy or other focal bone abnormality. Soft tissues are unremarkable.  IMPRESSION: Negative.   Electronically Signed By: Marin Olp M.D. On: 05/19/2021 10:42  Foot Imaging: Foot-R DG Complete: Results for orders placed in visit on 11/10/14 DG Foot Complete Right  Narrative 3 views of a skeletally mature individual were obtained of the right foot.  Study includes AP, oblique, lateral projections.  There is no definitive evidence of acute fracture or stress fracture identified this time. Overall rectus foot type. Joint space maintained.  Foot-L DG Complete: Results for orders placed during the hospital encounter of 05/17/21 DG Foot Complete Left  Narrative CLINICAL DATA:  Left foot pain. Blunt trauma to left foot April 2022 with persistent pain first and second metatarsals and lateral ankle.  EXAM: LEFT FOOT - COMPLETE 3+ VIEW  COMPARISON:  None.  FINDINGS: There is no evidence of fracture or dislocation. There is no evidence of arthropathy or other focal bone abnormality. Soft tissues are unremarkable.  IMPRESSION: Negative.   Electronically Signed By: Marin Olp M.D. On: 05/19/2021 10:41  Complexity Note: Imaging results  reviewed. Results shared with Ms. Thalia Bloodgood, using Layman's terms.                        Meds   Current Outpatient Medications:    ALBUTEROL SULFATE HFA IN, Inhale into the lungs., Disp: , Rfl:    cholecalciferol (VITAMIN D3) 25 MCG (1000 UNIT) tablet, Take 1,000 Units by mouth daily., Disp: , Rfl:    diphenhydrAMINE (BENADRYL) 25 MG tablet, Take 25 mg by mouth as needed., Disp: , Rfl:    Fluticasone Propionate (XHANCE) 93 MCG/ACT EXHU, , Disp: , Rfl:    HYDROcodone-acetaminophen (NORCO/VICODIN) 5-325 MG tablet, Take by mouth., Disp: , Rfl:    montelukast (SINGULAIR) 10 MG tablet, Take by mouth., Disp: , Rfl:    norethindrone-ethinyl estradiol (JUNEL 1/20) 1-20 MG-MCG tablet, Take 1 tablet daily continuous dosing, Disp: 84 tablet, Rfl: 3   nortriptyline (PAMELOR) 10 MG capsule, Take 25 mg by mouth at bedtime., Disp: , Rfl:    tiZANidine (ZANAFLEX) 2 MG tablet, TAKE 1-2 TABLETS BY MOUTH THREE TIMES A DAY AS NEEDED FOR MUSCLE SPASM, Disp: , Rfl:    traMADol (ULTRAM) 50  MG tablet, Take 50 mg by mouth every 6 (six) hours as needed., Disp: , Rfl:    lidocaine-prilocaine (EMLA) cream, Apply 1 application topically as needed. (Patient not taking: Reported on 06/05/2021), Disp: 30 g, Rfl: 0   mirtazapine (REMERON) 7.5 MG tablet, Take by mouth., Disp: , Rfl:    SYMBICORT 160-4.5 MCG/ACT inhaler, SMARTSIG:2 Inhalation Via Inhaler Twice Daily (Patient not taking: Reported on 08/15/2021), Disp: , Rfl:    zolpidem (AMBIEN) 5 MG tablet, Take by mouth., Disp: , Rfl:   ROS  Constitutional: Denies any fever or chills Gastrointestinal: No reported hemesis, hematochezia, vomiting, or acute GI distress Musculoskeletal: Denies any acute onset joint swelling, redness, loss of ROM, or weakness Neurological: No reported episodes of acute onset apraxia, aphasia, dysarthria, agnosia, amnesia, paralysis, loss of coordination, or loss of consciousness  Allergies  Ms. Strack is allergic to gabapentin.  [place]  Drug:  Ms. Dory  reports no history of drug use. Alcohol:  reports current alcohol use. Tobacco:  reports that she has never smoked. She has never used smokeless tobacco. Medical:  has a past medical history of Abnormal Pap smear of cervix (2010), Allergy, Asthma, BRCA negative (04/2020), Complex regional pain syndrome I, Family history of breast cancer, Family history of kidney cancer, Family history of ovarian cancer, Genital herpes, Genital warts, Hematuria, Increased risk of breast cancer (04/2020), and Vitamin D deficiency. Surgical: Ms. Riedlinger  has a past surgical history that includes Cervical biopsy w/ loop electrode excision (2010). Family: family history includes Breast cancer (age of onset: 85) in her paternal aunt; Diabetes in her father; Liver cancer (age of onset: 38) in her mother; Ovarian cancer in her maternal aunt; Sarcoidosis in her sister.  Constitutional Exam  General appearance: Well nourished, well developed, and well hydrated. In no apparent acute distress Vitals:   08/15/21 1449  BP: (!) 148/103  Pulse: (!) 135  Resp: 16  Temp: (!) 97.1 F (36.2 C)  SpO2: 100%  Weight: 133 lb (60.3 kg)  Height: '5\' 5"'  (1.651 m)   BMI Assessment: Estimated body mass index is 22.13 kg/m as calculated from the following:   Height as of this encounter: '5\' 5"'  (1.651 m).   Weight as of this encounter: 133 lb (60.3 kg).  BMI interpretation table: BMI level Category Range association with higher incidence of chronic pain  <18 kg/m2 Underweight   18.5-24.9 kg/m2 Ideal body weight   25-29.9 kg/m2 Overweight Increased incidence by 20%  30-34.9 kg/m2 Obese (Class I) Increased incidence by 68%  35-39.9 kg/m2 Severe obesity (Class II) Increased incidence by 136%  >40 kg/m2 Extreme obesity (Class III) Increased incidence by 254%   Patient's current BMI Ideal Body weight  Body mass index is 22.13 kg/m. Ideal body weight: 57 kg (125 lb 10.6 oz) Adjusted ideal body weight: 58.3 kg (128 lb 9.6  oz)   BMI Readings from Last 4 Encounters:  08/15/21 22.13 kg/m  06/05/21 23.86 kg/m  05/16/21 23.32 kg/m  06/14/20 26.79 kg/m   Wt Readings from Last 4 Encounters:  08/15/21 133 lb (60.3 kg)  06/05/21 139 lb (63 kg)  05/16/21 138 lb (62.6 kg)  06/14/20 161 lb (73 kg)    Psych/Mental status: Alert, oriented x 3 (person, place, & time)       Eyes: PERLA Respiratory: No evidence of acute respiratory distress  Assessment & Plan  Primary Diagnosis & Pertinent Problem List: The primary encounter diagnosis was Chronic foot pain (1ry area of Pain) (Left). 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  Visit Diagnosis: 1. Chronic foot pain (1ry area of Pain) (Left)   2. Chronic lower extremity pain (2ry area of Pain) (Left)   3. Chronic knee pain (3ry area of Pain) (Bilateral) (L>R)   4. Chronic low back pain (4th area of Pain) (Bilateral) (R>L) w/o sciatica   5. Chronic neck pain (5th area of Pain) (Midline)   6. Chronic pain syndrome   7. 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 Vitamin D insufficiency   9. DDD (degenerative disc disease), cervical   10. Tenosynovitis of tibialis posterior tendon (Left)    Problems updated and reviewed during this visit: Problem  Ddd (Degenerative Disc Disease), Cervical  Tenosynovitis of tibialis posterior tendon (Left)  Vitamin D Insufficiency   15.6 ng/ml-begun on 50,000 IU weekly by PCP      Plan of Care  Pharmacotherapy (Medications Ordered): No orders of the defined  types were placed in this encounter.  Procedure Orders    No procedure(s) ordered today   Lab Orders  No laboratory test(s) ordered today   Imaging Orders  No imaging studies ordered today   Referral Orders  No referral(s) requested today    Pharmacological management options:  Opioid Analgesics: I will not be prescribing any opioids at this time Membrane stabilizer: I will not be prescribing any at this time Muscle relaxant: I will not be prescribing any at this time NSAID: I will not be prescribing any at this time Other analgesic(s): I will not be prescribing any at this time     Interventional Therapies  Risk   Complexity Considerations:   Estimated body mass index is 23.32 kg/m as calculated from the following:   Height as of this encounter: 5' 4.5" (1.638 m).   Weight as of this encounter: 138 lb (62.6 kg). WNL   Planned   Pending:      Under consideration:   Local anesthetic and steroid injection around the posterior tibial tendon tenosynovitis, above the medial malleolus.   Completed:   None at this time   Therapeutic   Palliative (PRN) options:   None established    Provider-requested follow-up: Return if symptoms worsen or fail to improve. Recent Visits No visits were found meeting these conditions. Showing recent visits within past 90 days and meeting all other requirements Today's Visits Date Type Provider Dept  08/15/21 Office Visit Milinda Pointer, MD [facility]  Showing today's visits and meeting all other requirements Future Appointments No visits were found meeting these conditions. Showing future appointments within next 90 days and meeting all other requirements  Primary Care Physician: Tracie Harrier, MD Note by: Gaspar Cola, MD Date: 08/15/2021; Time: 6:18 PM

## 2021-08-15 ENCOUNTER — Encounter: Payer: Self-pay | Admitting: Pain Medicine

## 2021-08-15 ENCOUNTER — Ambulatory Visit: Payer: 59 | Attending: Pain Medicine | Admitting: Pain Medicine

## 2021-08-15 ENCOUNTER — Other Ambulatory Visit: Payer: Self-pay

## 2021-08-15 VITALS — BP 148/103 | HR 135 | Temp 97.1°F | Resp 16 | Ht 65.0 in | Wt 133.0 lb

## 2021-08-15 DIAGNOSIS — M79605 Pain in left leg: Secondary | ICD-10-CM | POA: Insufficient documentation

## 2021-08-15 DIAGNOSIS — G608 Other hereditary and idiopathic neuropathies: Secondary | ICD-10-CM | POA: Insufficient documentation

## 2021-08-15 DIAGNOSIS — G894 Chronic pain syndrome: Secondary | ICD-10-CM | POA: Insufficient documentation

## 2021-08-15 DIAGNOSIS — M545 Low back pain, unspecified: Secondary | ICD-10-CM | POA: Diagnosis present

## 2021-08-15 DIAGNOSIS — G8929 Other chronic pain: Secondary | ICD-10-CM | POA: Diagnosis present

## 2021-08-15 DIAGNOSIS — M5030 Other cervical disc degeneration, unspecified cervical region: Secondary | ICD-10-CM | POA: Insufficient documentation

## 2021-08-15 DIAGNOSIS — M25561 Pain in right knee: Secondary | ICD-10-CM | POA: Insufficient documentation

## 2021-08-15 DIAGNOSIS — M79672 Pain in left foot: Secondary | ICD-10-CM | POA: Insufficient documentation

## 2021-08-15 DIAGNOSIS — M542 Cervicalgia: Secondary | ICD-10-CM | POA: Diagnosis present

## 2021-08-15 DIAGNOSIS — E559 Vitamin D deficiency, unspecified: Secondary | ICD-10-CM | POA: Insufficient documentation

## 2021-08-15 DIAGNOSIS — M25562 Pain in left knee: Secondary | ICD-10-CM | POA: Insufficient documentation

## 2021-08-15 DIAGNOSIS — M659 Synovitis and tenosynovitis, unspecified: Secondary | ICD-10-CM | POA: Diagnosis present

## 2021-08-15 NOTE — Progress Notes (Signed)
Safety precautions to be maintained throughout the outpatient stay will include: orient to surroundings, keep bed in low position, maintain call bell within reach at all times, provide assistance with transfer out of bed and ambulation.  

## 2021-08-25 LAB — COLOGUARD: COLOGUARD: NEGATIVE

## 2021-08-27 ENCOUNTER — Encounter: Payer: Self-pay | Admitting: Obstetrics and Gynecology

## 2021-08-27 ENCOUNTER — Ambulatory Visit: Payer: 59 | Admitting: Pain Medicine

## 2021-09-20 ENCOUNTER — Telehealth: Payer: Self-pay | Admitting: Urology

## 2021-09-20 NOTE — Telephone Encounter (Signed)
.  left message to have patient return my call.  

## 2021-09-20 NOTE — Telephone Encounter (Signed)
Please call Stacey Morris and ask if she is ready to schedule her follow-up appointment for microscopic blood in the urine.  It is important that we continue surveillance on the urine as sometimes the only sign we get from the kidney or bladder cancer is blood in the urine even on a microscopic level.

## 2021-09-24 ENCOUNTER — Encounter: Payer: Self-pay | Admitting: *Deleted

## 2021-09-24 NOTE — Telephone Encounter (Signed)
.  left message to have patient return my call. Also sent mychart message

## 2021-10-08 ENCOUNTER — Other Ambulatory Visit: Payer: Self-pay | Admitting: Obstetrics and Gynecology

## 2021-10-08 DIAGNOSIS — Z3041 Encounter for surveillance of contraceptive pills: Secondary | ICD-10-CM

## 2021-10-08 MED ORDER — NORETHINDRONE ACET-ETHINYL EST 1-20 MG-MCG PO TABS: ORAL_TABLET | ORAL | 2 refills | Status: DC

## 2021-10-08 NOTE — Progress Notes (Signed)
Rx RF to [facility]. Annual due 10/23

## 2021-10-11 ENCOUNTER — Other Ambulatory Visit: Payer: Self-pay | Admitting: Obstetrics and Gynecology

## 2021-10-11 DIAGNOSIS — Z3041 Encounter for surveillance of contraceptive pills: Secondary | ICD-10-CM

## 2021-10-11 MED ORDER — NORETHINDRONE ACET-ETHINYL EST 1-20 MG-MCG PO TABS: ORAL_TABLET | ORAL | 2 refills | Status: DC

## 2021-10-11 NOTE — Progress Notes (Signed)
Rx RF to [facility]

## 2022-04-16 ENCOUNTER — Other Ambulatory Visit: Payer: Self-pay | Admitting: Obstetrics and Gynecology

## 2022-04-16 DIAGNOSIS — Z3041 Encounter for surveillance of contraceptive pills: Secondary | ICD-10-CM

## 2022-04-30 ENCOUNTER — Other Ambulatory Visit: Payer: Self-pay | Admitting: Student

## 2022-04-30 DIAGNOSIS — G90522 Complex regional pain syndrome I of left lower limb: Secondary | ICD-10-CM

## 2022-04-30 DIAGNOSIS — M542 Cervicalgia: Secondary | ICD-10-CM

## 2022-10-23 ENCOUNTER — Other Ambulatory Visit: Payer: Self-pay | Admitting: Obstetrics and Gynecology

## 2022-10-23 DIAGNOSIS — Z3041 Encounter for surveillance of contraceptive pills: Secondary | ICD-10-CM

## 2022-12-08 ENCOUNTER — Other Ambulatory Visit: Payer: Self-pay | Admitting: Obstetrics and Gynecology

## 2022-12-08 DIAGNOSIS — Z3041 Encounter for surveillance of contraceptive pills: Secondary | ICD-10-CM

## 2022-12-23 ENCOUNTER — Other Ambulatory Visit: Payer: Self-pay | Admitting: Sports Medicine

## 2022-12-23 DIAGNOSIS — G5601 Carpal tunnel syndrome, right upper limb: Secondary | ICD-10-CM

## 2022-12-23 DIAGNOSIS — M25531 Pain in right wrist: Secondary | ICD-10-CM

## 2022-12-26 ENCOUNTER — Ambulatory Visit: Payer: No Typology Code available for payment source

## 2022-12-30 ENCOUNTER — Telehealth: Payer: Self-pay

## 2022-12-30 ENCOUNTER — Other Ambulatory Visit: Payer: Self-pay | Admitting: Obstetrics and Gynecology

## 2022-12-30 DIAGNOSIS — Z3041 Encounter for surveillance of contraceptive pills: Secondary | ICD-10-CM

## 2022-12-30 NOTE — Telephone Encounter (Signed)
Patient called into triage about her Junel prescription. Stating her pharmacy sent her a text about it. It shows on my end [facility] did reorder it. Advised Orlene to call her pharmacy and if they don't have anything to let me know.

## 2023-01-02 ENCOUNTER — Telehealth: Payer: Self-pay | Admitting: *Deleted

## 2023-01-02 NOTE — Telephone Encounter (Signed)
"  Back then my name would have been Stacey Morris in the system."

## 2023-01-02 NOTE — Telephone Encounter (Signed)
I left the patient a message asking her to call [facility] for her medical records, [phone].

## 2023-01-02 NOTE — Telephone Encounter (Signed)
"  I'm trying to obtain medical records.  I was last seen in 2016.  I don't have access to that in MyChart."

## 2023-01-03 ENCOUNTER — Other Ambulatory Visit: Payer: Self-pay | Admitting: Obstetrics and Gynecology

## 2023-01-03 DIAGNOSIS — Z3041 Encounter for surveillance of contraceptive pills: Secondary | ICD-10-CM

## 2023-01-06 NOTE — Progress Notes (Unsigned)
PCP: Barbette Reichmann, MD   No chief complaint on file.   HPI:      [name] is a 52 y.o. [phone] whose LMP was No LMP recorded. (Menstrual status: Perimenopausal)., presents today for her annual examination.  Her menses are Q3 months with cont dosing of OCPs, lasting 5 days lighter flow LMP but more mod flow previous periods. Changed to 20 mcg pill last yr, doing well. Dysmenorrhea mild to moderate, sometimes not relieved with NSAIDs/heating pad. She does not have intermenstrual bleeding.  Ultrasound 2019 revealed SS and IM fibroids, the largest measured was 3.5 x 2.7 cm.  Having significant vasomotor sx with full body sweating. Under increased stress and now has chronic pain syndrome in foot. Hx of worsening vasomotor sx since 4/20 (same time as husband passed away). Normal TSH October 16, 2022.  Sex activity: not sexually active. She does not have vaginal dryness.  Last Pap: 06/05/21  Results were: no abnormalities /neg HPV DNA  Hx of STDs: HSV; HPV on pap, s/p LEEP 2010  Last mammogram: 06/21/21 at [facility];  Results were: normal--routine follow-up in 12 months.  There is a FH of breast cancer in her pat aunt and mat aunt. There is a FH of ovarian cancer in her mat aunt. Also kidney cancer in her dad and PGM. The patient does do self-breast exams. Pt is MyRisk neg 5/21 except MSH6 VUS; IBIS=15.2%/riskscore=21.4%  Colonoscopy: never, NEG cologuard 12/22; repeat after 3 yrs  Tobacco use: The patient denies current or previous tobacco use. Alcohol use: none  No drug use Exercise: min active now due to foot injury  She does get adequate calcium but not Vitamin D in her diet. Hx of Vit D deficiency and had normal labs 10/16/22, slightly low 9/22.  Labs with PCP.   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   Allergy    Asthma    BRCA negative 04/2020   MyRisk neg except MSH6 VUS   Complex regional pain syndrome I     Family history of breast cancer    Family history of kidney cancer    Family history of ovarian cancer    Genital herpes    Genital warts    Hematuria    Increased risk of breast cancer 04/2020   IBIS=15.2%/riskscore=21.4%   Screening for colon cancer 07/2021   NEG cologuard, repeat after 3 yrs   Vitamin D deficiency     Past Surgical History:  Procedure Laterality Date   CERVICAL BIOPSY  W/ LOOP ELECTRODE EXCISION  2010   LGSIL benign pathology    Family History  Problem Relation Age of Onset   Liver cancer Mother 64   Diabetes Father    Sarcoidosis Sister    Ovarian cancer Maternal Aunt    Breast cancer Paternal Aunt 49    Social History   Socioeconomic History   Marital status: Widowed    Spouse name: Not on file   Number of children: 3   Years of education: Not on file   Highest education level: Not on file  Occupational History   Occupation: Print production planner  Tobacco Use   Smoking status: Never   Smokeless tobacco: Never  Vaping Use   Vaping Use: Never used  Substance and Sexual Activity   Alcohol use: Yes    Alcohol/week: 0.0 standard drinks of alcohol    Comment: occasional   Drug use: No  Sexual activity: Not Currently    Partners: Male    Birth control/protection: Pill  Other Topics Concern   Not on file  Social History Narrative   Not on file   Social Determinants of Health   Financial Resource Strain: Not on file  Food Insecurity: Not on file  Transportation Needs: Not on file  Physical Activity: Inactive (02/25/2019)   Exercise Vital Sign    Days of Exercise per Week: 0 days    Minutes of Exercise per Session: 0 min  Stress: Stress Concern Present (02/25/2019)   [employer] of Occupational Health - Occupational Stress Questionnaire    Feeling of Stress : Rather much  Social Connections: Somewhat Isolated (02/25/2019)   Social Connection and Isolation Panel [NHANES]    Frequency of Communication with Friends and Family: Twice a week     Frequency of Social Gatherings with Friends and Family: Once a week    Attends Religious Services: Never    Database administrator or Organizations: No    Attends Banker Meetings: Never    Marital Status: Living with partner  Intimate Partner Violence: Not At Risk (02/25/2019)   Humiliation, Afraid, Rape, and Kick questionnaire    Fear of Current or Ex-Partner: No    Emotionally Abused: No    Physically Abused: No    Sexually Abused: No     Current Outpatient Medications:    ALBUTEROL SULFATE HFA IN, Inhale into the lungs., Disp: , Rfl:    cholecalciferol (VITAMIN D3) 25 MCG (1000 UNIT) tablet, Take 1,000 Units by mouth daily., Disp: , Rfl:    diphenhydrAMINE (BENADRYL) 25 MG tablet, Take 25 mg by mouth as needed., Disp: , Rfl:    Fluticasone Propionate (XHANCE) 93 MCG/ACT EXHU, , Disp: , Rfl:    HYDROcodone-acetaminophen (NORCO/VICODIN) 5-325 MG tablet, Take by mouth., Disp: , Rfl:    JUNEL 1/20 1-20 MG-MCG tablet, TAKE 1 TABLET DAILY CONTINUOUS DOSING, Disp: 21 tablet, Rfl: 0   lidocaine-prilocaine (EMLA) cream, Apply 1 application topically as needed. (Patient not taking: Reported on 06/05/2021), Disp: 30 g, Rfl: 0   mirtazapine (REMERON) 7.5 MG tablet, Take by mouth., Disp: , Rfl:    montelukast (SINGULAIR) 10 MG tablet, Take by mouth., Disp: , Rfl:    nortriptyline (PAMELOR) 10 MG capsule, Take 25 mg by mouth at bedtime., Disp: , Rfl:    SYMBICORT 160-4.5 MCG/ACT inhaler, SMARTSIG:2 Inhalation Via Inhaler Twice Daily (Patient not taking: Reported on 08/15/2021), Disp: , Rfl:    tiZANidine (ZANAFLEX) 2 MG tablet, TAKE 1-2 TABLETS BY MOUTH THREE TIMES A DAY AS NEEDED FOR MUSCLE SPASM, Disp: , Rfl:    traMADol (ULTRAM) 50 MG tablet, Take 50 mg by mouth every 6 (six) hours as needed., Disp: , Rfl:    zolpidem (AMBIEN) 5 MG tablet, Take by mouth., Disp: , Rfl:      ROS:  Review of Systems  Constitutional:  Positive for fatigue. Negative for fever and unexpected weight  change.  Respiratory:  Negative for cough, shortness of breath and wheezing.   Cardiovascular:  Negative for chest pain, palpitations and leg swelling.  Gastrointestinal:  Negative for blood in stool, constipation, diarrhea, nausea and vomiting.  Endocrine: Negative for cold intolerance, heat intolerance and polyuria.  Genitourinary:  Negative for dyspareunia, dysuria, flank pain, frequency, genital sores, hematuria, menstrual problem, pelvic pain, urgency, vaginal bleeding, vaginal discharge and vaginal pain.  Musculoskeletal:  Positive for arthralgias. Negative for back pain, joint swelling and myalgias.  Skin:  Negative for rash.  Neurological:  Positive for numbness. Negative for dizziness, syncope, light-headedness and headaches.  Hematological:  Negative for adenopathy.  Psychiatric/Behavioral:  Negative for agitation, confusion, sleep disturbance and suicidal ideas. The patient is not nervous/anxious.    BREAST: No symptoms    Objective: There were no vitals taken for this visit.   Physical Exam Constitutional:      Appearance: She is well-developed.  Genitourinary:     Vulva normal.     Right Labia: No rash, tenderness or lesions.    Left Labia: No tenderness, lesions or rash.    No vaginal discharge, erythema or tenderness.      Right Adnexa: not tender and no mass present.    Left Adnexa: not tender and no mass present.    No cervical friability or polyp.     Uterus is not enlarged or tender.  Breasts:    Right: No mass, nipple discharge, skin change or tenderness.     Left: No mass, nipple discharge, skin change or tenderness.  Neck:     Thyroid: No thyromegaly.  Cardiovascular:     Rate and Rhythm: Normal rate and regular rhythm.     Heart sounds: Normal heart sounds. No murmur heard. Pulmonary:     Effort: Pulmonary effort is normal.     Breath sounds: Normal breath sounds.  Abdominal:     Palpations: Abdomen is soft.     Tenderness: There is no abdominal  tenderness. There is no guarding or rebound.  Musculoskeletal:        General: Normal range of motion.     Cervical back: Normal range of motion.  Lymphadenopathy:     Cervical: No cervical adenopathy.  Neurological:     General: No focal deficit present.     Mental Status: She is alert and oriented to person, place, and time.     Cranial Nerves: No cranial nerve deficit.  Skin:    General: Skin is warm and dry.  Psychiatric:        Mood and Affect: Mood normal.        Behavior: Behavior normal.        Thought Content: Thought content normal.        Judgment: Judgment normal.  Vitals reviewed.     Assessment/Plan: Encounter for annual routine gynecological examination  Cervical cancer screening - Plan: Cytology - PAP  Screening for HPV (human papillomavirus) - Plan: Cytology - PAP  History of cervical dysplasia - Plan: Cytology - PAP  Encounter for surveillance of contraceptive pills - Plan: norethindrone-ethinyl estradiol (JUNEL 1/20) 1-20 MG-MCG tablet; OCP RF. Will eval menses next yr.   Encounter for screening mammogram for malignant neoplasm of breast; pt has mammo sched.   Family history of breast cancer--Pt is MyRisk neg  Increased risk of breast cancer--pt aware of monthly SBE, yearly CBE and mammos, resume Vit D supp  Perimenopausal vasomotor symptoms--on OCPs. Question related to stress/pain syndrome now. Doubt is all hormonal given dose of hormones on cont dosing of OCPs.  Screening for colon cancer - Plan: Cologuard; colonoscopy/cologuard discussed. Pt elects cologuard. Ref sent. Will f/u with results.   No orders of the defined types were placed in this encounter.           GYN counsel breast self exam, mammography screening, menopause, adequate intake of calcium and vitamin D, diet and exercise    F/U  No follow-ups on file.  Jahmire Ruffins B. Ilynn Stauffer, PA-C 01/06/2023 4:57 PM

## 2023-01-07 ENCOUNTER — Ambulatory Visit (INDEPENDENT_AMBULATORY_CARE_PROVIDER_SITE_OTHER): Payer: No Typology Code available for payment source | Admitting: Obstetrics and Gynecology

## 2023-01-07 ENCOUNTER — Encounter: Payer: Self-pay | Admitting: Obstetrics and Gynecology

## 2023-01-07 VITALS — BP 110/80 | Ht 64.0 in | Wt 131.0 lb

## 2023-01-07 DIAGNOSIS — N6311 Unspecified lump in the right breast, upper outer quadrant: Secondary | ICD-10-CM

## 2023-01-07 DIAGNOSIS — N951 Menopausal and female climacteric states: Secondary | ICD-10-CM

## 2023-01-07 DIAGNOSIS — Z01419 Encounter for gynecological examination (general) (routine) without abnormal findings: Secondary | ICD-10-CM | POA: Diagnosis not present

## 2023-01-07 DIAGNOSIS — Z9189 Other specified personal risk factors, not elsewhere classified: Secondary | ICD-10-CM

## 2023-01-07 DIAGNOSIS — Z803 Family history of malignant neoplasm of breast: Secondary | ICD-10-CM

## 2023-01-07 DIAGNOSIS — Z1231 Encounter for screening mammogram for malignant neoplasm of breast: Secondary | ICD-10-CM

## 2023-01-07 DIAGNOSIS — A6004 Herpesviral vulvovaginitis: Secondary | ICD-10-CM

## 2023-01-07 MED ORDER — VALACYCLOVIR HCL 500 MG PO TABS
500.0000 mg | ORAL_TABLET | Freq: Two times a day (BID) | ORAL | 1 refills | Status: AC
Start: 2023-01-07 — End: 2023-01-10

## 2023-01-07 NOTE — Patient Instructions (Signed)
I value your feedback and you entrusting us with your care. If you get a [facility] patient survey, I would appreciate you taking the time to let us know about your experience today. Thank you!  [facility]: [phone]      

## 2023-01-09 ENCOUNTER — Ambulatory Visit
Admission: RE | Admit: 2023-01-09 | Discharge: 2023-01-09 | Disposition: A | Discharge status: Home / Self Care | Payer: No Typology Code available for payment source | Source: Ambulatory Visit | Attending: Student in an Organized Health Care Education/Training Program | Admitting: Student in an Organized Health Care Education/Training Program

## 2023-01-09 ENCOUNTER — Encounter: Payer: Self-pay | Admitting: Obstetrics and Gynecology

## 2023-01-09 ENCOUNTER — Encounter: Payer: Self-pay | Admitting: Student in an Organized Health Care Education/Training Program

## 2023-01-09 ENCOUNTER — Other Ambulatory Visit: Payer: Self-pay | Admitting: Obstetrics and Gynecology

## 2023-01-09 ENCOUNTER — Ambulatory Visit
Payer: No Typology Code available for payment source | Attending: Student in an Organized Health Care Education/Training Program | Admitting: Student in an Organized Health Care Education/Training Program

## 2023-01-09 VITALS — BP 125/94 | HR 113 | Temp 98.0°F | Resp 16 | Ht 64.0 in | Wt 131.0 lb

## 2023-01-09 DIAGNOSIS — G894 Chronic pain syndrome: Secondary | ICD-10-CM | POA: Diagnosis not present

## 2023-01-09 DIAGNOSIS — G608 Other hereditary and idiopathic neuropathies: Secondary | ICD-10-CM | POA: Diagnosis not present

## 2023-01-09 DIAGNOSIS — G90522 Complex regional pain syndrome I of left lower limb: Secondary | ICD-10-CM | POA: Insufficient documentation

## 2023-01-09 DIAGNOSIS — M792 Neuralgia and neuritis, unspecified: Secondary | ICD-10-CM | POA: Diagnosis present

## 2023-01-09 MED ORDER — LIDOCAINE-PRILOCAINE 2.5-2.5 % EX CREA: 1.0000 | TOPICAL_CREAM | CUTANEOUS | 0 refills | Status: AC | PRN

## 2023-01-09 NOTE — Progress Notes (Signed)
Safety precautions to be maintained throughout the outpatient stay will include: orient to surroundings, keep bed in low position, maintain call bell within reach at all times, provide assistance with transfer out of bed and ambulation.  

## 2023-01-09 NOTE — Progress Notes (Signed)
Rx RF emla for pain with HSV outbreak

## 2023-01-09 NOTE — Progress Notes (Addendum)
Patient: Stacey Morris  Service Category: E/M  Provider: Edward Jolly, MD  DOB: [birth year]  DOS: 01/09/2023  Referring Provider: Lonell Face, MD  MRN: [id number]  Setting: Ambulatory outpatient  PCP: Barbette Reichmann, MD  Type: New Patient  Specialty: Interventional Pain Management    Location: Office  Delivery: Face-to-face     Primary Reason(s) for Visit: Encounter for initial evaluation of one or more chronic problems (new to examiner) potentially causing chronic pain, and posing a threat to normal musculoskeletal function. (Level of risk: High) CC: feet pain (L>R)  HPI  Stacey Morris is a 52 y.o. year old, female patient, who comes for the first time to our practice referred by Lonell Face, MD for our initial evaluation of her chronic pain. She has Genital herpes; Asthma; Allergies; Abnormal Pap smear of cervix; Intramural and subserous leiomyoma of uterus; Vitamin D insufficiency; Family history of ovarian cancer; Asthma, stable, moderate persistent; Chronic low back pain (4th area of Pain) (Bilateral) (R>L) w/o sciatica; DDD (degenerative disc disease), lumbosacral; Family history of kidney cancer; Hematuria, microscopic; Insomnia; Low serum vitamin D; Lumbar radiculopathy; Major depressive disorder, recurrent, in partial remission (HCC); Seasonal allergic rhinitis; Asthma, stable, mild intermittent; Complex regional pain syndrome type 1 of left lower extremity; H/O degenerative disc disease; RAD (reactive airway disease), moderate persistent, uncomplicated; Chronic pain syndrome; Pharmacologic therapy; Disorder of skeletal system; Problems influencing health status; Chronic foot pain (1ry area of Pain) (Left); Chronic lower extremity pain (2ry area of Pain) (Left); Chronic knee pain (3ry area of Pain) (Bilateral) (L>R); Chronic neck pain (5th area of Pain) (Midline); Cervicogenic headache; Occipital headache (Bilateral); Greater occipital neuralgia (Bilateral); Chronic shoulder pain (6th area of  Pain) (Bilateral); Carpal tunnel syndrome (Right); Metatarsalgia of foot (Left); Tenosynovitis of ankle (Left); 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 Today she comes in for evaluation of her feet pain (L>R)  Pain Assessment: Location: Left (L>R) Foot Radiating: Pain radiates from left foot uptowards ankle constant but at worse it can go up towards leg all the way to the left buttocks to left lower back. Onset: More than a month ago Duration: Neuropathic pain Quality: Aching, Burning, Constant, Sharp, Shooting, Stabbing, Tender, Throbbing, Tingling, Tightness Severity: 8 /10 (subjective, self-reported pain score)  Effect on ADL: 'very limiting and makes it hard to drive as well. Slows me down" Timing: Constant Modifying factors: Denies BP: (!) 125/94  HR: (!) 113  Onset and Duration: Gradual and Present longer than 3 months Cause of pain: Unknown Severity: Getting worse, NAS-11 at its worse: 10/10, NAS-11 at its best: 6/10, NAS-11 now: 6/10, and NAS-11 on the average: 8/10 Timing: Not influenced by the time of the day Aggravating Factors: Motion, Prolonged sitting, Prolonged standing, Walking, and Working Alleviating Factors:  Denies Associated Problems: Color changes, Depression, Dizziness, Fatigue, Inability to concentrate, Nausea, Numbness, Sadness, Spasms, Sweating, Swelling, Tingling, Weakness, and Pain that does not allow patient to sleep Quality of Pain: Aching, Agonizing, Burning, Constant, Disabling, Exhausting, Feeling of constriction, Hot, Sharp, Shooting, Stabbing, Tender, Tingling, Uncomfortable, and Work related Previous Examinations or Tests: Bone scan, CT scan, EMG/PNCV, MRI scan, Nerve block,  X-rays, Nerve conduction test, Neurological evaluation, and Orthopedic evaluation Previous Treatments: Epidural steroid injections, Narcotic medications, Steroid treatments by mouth, and TENS  Stacey Morris is being  evaluated for possible interventional pain management therapies for the treatment of her chronic pain.   Patient is a pleasant 52 year old female who presents with a chief complaint of left foot pain that radiates up to her left ankle secondary to complex regional pain syndrome.  She also has EMG proven sensory peripheral neuropathy.  She states that her pain is very severe.  She does endorse color changes, temperature changes, sensitivity of her left foot.  She has difficulty with movement of that left foot.  She has tried and failed various neuropathic's including Lyrica, gabapentin.  She is currently on nortriptyline 25 mg nightly.  She is being referred here from Dr. Sherryll Burger to consider interventional therapies for her complex regional pain syndrome, left intractable lower extremity neuropathic pain secondary to peripheral neuropathy.    Meds   Current Outpatient Medications:    albuterol (PROVENTIL) (2.5 MG/3ML) 0.083% nebulizer solution, Take 3 mLs (2.5 mg total) by nebulization every 6 (six) hours as needed for Wheezing, Disp: , Rfl:    ALBUTEROL SULFATE HFA IN, Inhale into the lungs., Disp: , Rfl:    budesonide-formoterol (SYMBICORT) 160-4.5 MCG/ACT inhaler, Inhale into the lungs., Disp: , Rfl:    buPROPion (WELLBUTRIN XL) 150 MG 24 hr tablet, Take 150 mg by mouth daily., Disp: , Rfl:    diphenhydrAMINE (BENADRYL) 25 MG tablet, Take 25 mg by mouth as needed., Disp: , Rfl:    lidocaine-prilocaine (EMLA) cream, Apply 1 Application topically as needed., Disp: 30 g, Rfl: 0   nortriptyline (PAMELOR) 25 MG capsule, Take 25 mg by mouth at bedtime., Disp: , Rfl:    tiZANidine (ZANAFLEX) 2 MG tablet, TAKE 1-2 TABLETS BY MOUTH THREE TIMES A DAY AS NEEDED FOR MUSCLE SPASM, Disp: , Rfl:     traMADol (ULTRAM) 50 MG tablet, Take 50 mg by mouth every 6 (six) hours as needed., Disp: , Rfl:    Vitamin D, Ergocalciferol, (DRISDOL) 1.25 MG (50000 UNIT) CAPS capsule, Take 50,000 Units by mouth once a week., Disp: , Rfl:    montelukast (SINGULAIR) 10 MG tablet, Take by mouth., Disp: , Rfl:    valACYclovir (VALTREX) 500 MG tablet, Take 1 tablet (500 mg total) by mouth 2 (two) times daily for 3 days. Prn sx (Patient not taking: Reported on 01/09/2023), Disp: 30 tablet, Rfl: 1   zolpidem (AMBIEN) 5 MG tablet, Take by mouth., Disp: , Rfl:   Imaging Review  DG Cervical Spine With Flex & Extend  Narrative CLINICAL DATA:  Neck pain.  No injury.  EXAM: CERVICAL SPINE COMPLETE WITH FLEXION AND EXTENSION VIEWS  COMPARISON:  None.  FINDINGS: Vertebral body alignment and heights are normal. There is mild spondylosis of the cervical spine most notable at the C5-6 level. Minimal disc space narrowing at the C5-6 level. Prevertebral soft tissues are normal. No instability on flexion and extension mild uncovertebral joint spurring worse at the C5-6 level. Atlantoaxial articulation is normal. No significant left-sided neural foraminal narrowing. Suboptimal positioning for adequate evaluation of the right neural foramina.  IMPRESSION: Mild spondylosis of the cervical spine with disc disease at the C5-6 level. No instability on flexion and extension.   Electronically Signed By: Elberta Fortis M.D. On: 05/19/2021 10:34  DG Shoulder Right  Narrative CLINICAL DATA:  Right shoulder pain.  No injury.  EXAM: RIGHT SHOULDER - 2+ VIEW  COMPARISON:  None.  FINDINGS: There is no evidence of fracture or dislocation. There is no evidence of arthropathy or other focal bone abnormality. Soft tissues are unremarkable.  IMPRESSION: Negative.   Electronically Signed By: Elberta Fortis M.D. On: 05/19/2021 10:40  Shoulder-L DG: Results for orders placed during the hospital encounter of  05/17/21  DG Shoulder Left  Narrative CLINICAL DATA:  Left shoulder pain.  No injury.  EXAM: LEFT SHOULDER - 2+ VIEW  COMPARISON:  None.  FINDINGS: There is no evidence of fracture or dislocation. There is no evidence of arthropathy or other focal bone abnormality. Soft tissues are unremarkable.  IMPRESSION: Negative.   Electronically Signed By: Elberta Fortis M.D. On: 05/19/2021 10:40    Narrative CLINICAL DATA:  Low back pain.  No injury.  EXAM: LUMBAR SPINE - COMPLETE WITH BENDING VIEWS  COMPARISON:  None.  FINDINGS: Vertebral body alignment and heights are normal. There is minimal spondylosis of the lumbar spine to include facet arthropathy. No evidence of instability on flexion and extension. No significant patient narrowing. No evidence of compression fracture or spondylolisthesis/spondylolysis.  IMPRESSION: Minimal spondylosis of the lumbar spine. No instability on flexion and extension.   Electronically Signed By: Elberta Fortis M.D. On: 05/19/2021 10:39  DG Knee Complete 4 Views Right  Narrative CLINICAL DATA:  Right knee pain.  No injury.  EXAM: RIGHT KNEE - COMPLETE 4+ VIEW  COMPARISON:  None.  FINDINGS: No evidence of fracture, dislocation, or joint effusion. No evidence of arthropathy or other focal bone abnormality. Soft tissues are unremarkable.  IMPRESSION: Negative.   Electronically Signed By: Elberta Fortis M.D. On: 05/19/2021 10:42   DG Knee Complete 4 Views Left  Narrative CLINICAL DATA:  Left knee pain.  No injury.  EXAM: LEFT KNEE - COMPLETE 4+ VIEW  COMPARISON:  None.  FINDINGS: No evidence of fracture, dislocation, or joint effusion. No evidence of arthropathy or other focal bone abnormality. Soft tissues are unremarkable.  IMPRESSION: Negative.   Electronically Signed By: Elberta Fortis M.D. On: 05/19/2021 10:43   DG Ankle Complete Left  Narrative CLINICAL DATA:  Left ankle pain persistent  laterally after blunt trauma April 2022.  EXAM: LEFT ANKLE COMPLETE - 3+ VIEW  COMPARISON:  None.  FINDINGS: There is no evidence of fracture, dislocation, or joint effusion. There is no evidence of arthropathy or other focal bone abnormality. Soft tissues are unremarkable.  IMPRESSION: Negative.   Electronically Signed By: Elberta Fortis M.D. On: 05/19/2021 10:42   Narrative 3 views of a skeletally mature individual were obtained of the right foot.  Study includes AP, oblique, lateral projections.  There is no definitive evidence of acute fracture or stress fracture identified this time. Overall rectus foot type. Joint space maintained.  DG Foot Complete Left  Narrative CLINICAL DATA:  Left foot pain. Blunt trauma to left foot April 2022 with persistent pain first and second metatarsals and lateral ankle.  EXAM: LEFT FOOT - COMPLETE 3+ VIEW  COMPARISON:  None.  FINDINGS: There is no evidence of fracture or dislocation. There is no evidence of arthropathy or other focal bone abnormality. Soft tissues are unremarkable.  IMPRESSION: Negative.   Electronically Signed By: Elberta Fortis M.D. On: 05/19/2021 10:41   Complexity Note: Imaging results reviewed.                         ROS  Cardiovascular: Abnormal heart rhythm Pulmonary or Respiratory: Wheezing and difficulty taking a deep full breath (Asthma) and Shortness of breath Neurological: Abnormal skin sensations (Peripheral Neuropathy) and Curved spine Psychological-Psychiatric: Depressed and Difficulty sleeping and or falling asleep Gastrointestinal: Reflux or  heatburn Genitourinary: Peeing blood and Recurrent Urinary Tract infections Hematological: No reported hematological signs or symptoms such as prolonged bleeding, low or poor functioning platelets, bruising or bleeding easily, hereditary bleeding problems, low energy levels due to low hemoglobin or being anemic Endocrine: No reported endocrine  signs or symptoms such as high or low blood sugar, rapid heart rate due to high thyroid levels, obesity or weight gain due to slow thyroid or thyroid disease Rheumatologic: Joint aches and or swelling due to excess weight (Osteoarthritis) Musculoskeletal: Negative for myasthenia gravis, muscular dystrophy, multiple sclerosis or malignant hyperthermia Work History: Disabled and Out on work excuse  Allergies  Stacey Morris is allergic to gabapentin.  Laboratory Chemistry Profile   Renal Lab Results  Component Value Date   BUN 18 05/16/2021   CREATININE 0.94 05/16/2021   BCR 19 05/16/2021   SPECGRAV 1.025 06/14/2020   PHUR 6.5 06/14/2020   PROTEINUR Negative 06/14/2020     Electrolytes Lab Results  Component Value Date   NA 139 05/16/2021   K 4.2 05/16/2021   CL 99 05/16/2021   CALCIUM 9.9 05/16/2021   MG 2.4 (H) 05/16/2021     Hepatic Lab Results  Component Value Date   AST 9 05/16/2021   ALBUMIN 4.4 05/16/2021   ALKPHOS 91 05/16/2021     ID Lab Results  Component Value Date   SARSCOV2NAA Not Detected 08/11/2019     Bone Lab Results  Component Value Date   25OHVITD1 29 (L) 05/16/2021   25OHVITD2 21 05/16/2021   25OHVITD3 8.1 05/16/2021     Endocrine Lab Results  Component Value Date   GLUCOSE 96 05/16/2021   GLUCOSEU Negative 06/14/2020     Neuropathy Lab Results  Component Value Date   VITAMINB12 824 05/16/2021   FOLATE 8.8 04/26/2020     CNS No results found for: "COLORCSF", "APPEARCSF", "RBCCOUNTCSF", "WBCCSF", "POLYSCSF", "LYMPHSCSF", "EOSCSF", "PROTEINCSF", "GLUCCSF", "JCVIRUS", "CSFOLI", "IGGCSF", "LABACHR", "ACETBL"   Inflammation (CRP: Acute  ESR: Chronic) Lab Results  Component Value Date   CRP 3 05/16/2021   ESRSEDRATE 26 05/16/2021     Rheumatology No results found for: "RF", "ANA", "LABURIC", "URICUR", "LYMEIGGIGMAB", "LYMEABIGMQN", "HLAB27"   Coagulation No results found for: "INR", "LABPROT", "APTT", "PLT", "DDIMER", "LABHEMA",  "VITAMINK1", "AT3"   Cardiovascular No results found for: "BNP", "CKTOTAL", "CKMB", "TROPONINI", "HGB", "HCT", "LABVMA", "EPIRU", "EPINEPH24HUR", "NOREPRU", "NOREPI24HUR", "DOPARU", "DOPAM24HRUR"   Screening Lab Results  Component Value Date   SARSCOV2NAA Not Detected 08/11/2019     Cancer No results found for: "CEA", "CA125", "LABCA2"   Allergens No results found for: "ALMOND", "APPLE", "ASPARAGUS", "AVOCADO", "BANANA", "BARLEY", "BASIL", "BAYLEAF", "GREENBEAN", "LIMABEAN", "WHITEBEAN", "BEEFIGE", "REDBEET", "BLUEBERRY", "BROCCOLI", "CABBAGE", "MELON", "CARROT", "CASEIN", "CASHEWNUT", "CAULIFLOWER", "CELERY"     Note: Lab results reviewed.  PFSH  Drug: Stacey Morris  reports no history of drug use. Alcohol:  reports current alcohol use. Tobacco:  reports that she has never smoked. She has never used smokeless tobacco. Medical:  has a past medical history of Abnormal Pap smear of cervix (2010), Allergy, Asthma, BRCA negative (04/2020), Carpal tunnel syndrome, Complex regional pain syndrome I, Family history of breast cancer, Family history of kidney cancer, Family history of ovarian cancer, Genital herpes, Genital warts, Hematuria, Increased risk of breast cancer (04/2020), Peripheral neuropathy (2020), Screening for colon cancer (07/2021), and Vitamin D deficiency. Family: family history includes Brain cancer in her paternal aunt; Breast cancer (age of onset: 101) in her paternal aunt; Diabetes in her father; Liver cancer (age of onset: 30) in  her mother; Ovarian cancer in her maternal aunt; Ovarian cancer (age of onset: 31) in her sister; Sarcoidosis in her sister.  Past Surgical History:  Procedure Laterality Date   CERVICAL BIOPSY  W/ LOOP ELECTRODE EXCISION  2010   LGSIL benign pathology   Active Ambulatory Problems    Diagnosis Date Noted   Genital herpes 05/05/2020   Asthma    Allergies    Abnormal Pap smear of cervix 08/19/2008   Intramural and subserous leiomyoma of uterus  02/25/2019   Vitamin D insufficiency 05/28/2019   Family history of ovarian cancer 04/26/2020   Asthma, stable, moderate persistent 06/21/2019   Chronic low back pain (4th area of Pain) (Bilateral) (R>L) w/o sciatica 01/30/2015   DDD (degenerative disc disease), lumbosacral 02/26/2018   Family history of kidney cancer 09/20/2019   Hematuria, microscopic 05/05/2020   Insomnia 05/25/2019   Low serum vitamin D 05/25/2019   Lumbar radiculopathy 02/26/2018   Major depressive disorder, recurrent, in partial remission (HCC) 05/25/2019   Seasonal allergic rhinitis 02/26/2018   Asthma, stable, mild intermittent 05/05/2020   Complex regional pain syndrome type 1 of left lower extremity 03/11/2021   H/O degenerative disc disease 05/15/2021   RAD (reactive airway disease), moderate persistent, uncomplicated 02/26/2018   Chronic pain syndrome 05/16/2021   Pharmacologic therapy 05/16/2021   Disorder of skeletal system 05/16/2021   Problems influencing health status 05/16/2021   Chronic foot pain (1ry area of Pain) (Left) 05/16/2021   Chronic lower extremity pain (2ry area of Pain) (Left) 05/16/2021   Chronic knee pain (3ry area of Pain) (Bilateral) (L>R) 05/16/2021   Chronic neck pain (5th area of Pain) (Midline) 05/16/2021   Cervicogenic headache 05/16/2021   Occipital headache (Bilateral) 05/16/2021   Greater occipital neuralgia (Bilateral) 05/16/2021   Chronic shoulder pain (6th area of Pain) (Bilateral) 05/16/2021   Carpal tunnel syndrome (Right) 05/16/2021   Metatarsalgia of foot (Left) 05/18/2021   Tenosynovitis of ankle (Left) 05/18/2021   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) 05/18/2021   History of cervical dysplasia 06/05/2021   Family history of breast cancer 06/05/2021   Increased risk of breast cancer 06/05/2021   DDD (degenerative disc disease), cervical 08/15/2021   Tenosynovitis of tibialis posterior tendon (Left) 08/15/2021   Intractable neuropathic  pain of foot, left 01/09/2023   Resolved Ambulatory Problems    Diagnosis Date Noted   No Resolved Ambulatory Problems   Past Medical History:  Diagnosis Date   Allergy    BRCA negative 04/2020   Carpal tunnel syndrome    Complex regional pain syndrome I    Genital warts    Hematuria    Peripheral neuropathy 2020   Screening for colon cancer 07/2021   Vitamin D deficiency    Constitutional Exam  General appearance: Well nourished, well developed, and well hydrated. In no apparent acute distress Vitals:   01/09/23 1309  BP: (!) 125/94  Pulse: (!) 113  Resp: 16  Temp: 98 F (36.7 C)  TempSrc: Temporal  Weight: 131 lb (59.4 kg)  Height: 5\' 4"  (1.626 m)   BMI Assessment: Estimated body mass index is 22.49 kg/m as calculated from the following:   Height as of this encounter: 5\' 4"  (1.626 m).   Weight as of this encounter: 131 lb (59.4 kg).  BMI interpretation table: BMI level Category Range association with higher incidence of chronic pain  <18 kg/m2 Underweight   18.5-24.9 kg/m2 Ideal body weight   25-29.9 kg/m2 Overweight Increased incidence by 20%  30-34.9 kg/m2 Obese (Class I) Increased incidence by 68%  35-39.9 kg/m2 Severe obesity (Class II) Increased incidence by 136%  >40 kg/m2 Extreme obesity (Class III) Increased incidence by 254%   Patient's current BMI Ideal Body weight  Body mass index is 22.49 kg/m. Ideal body weight: 54.7 kg (120 lb 9.5 oz) Adjusted ideal body weight: 56.6 kg (124 lb 12.1 oz)   BMI Readings from Last 4 Encounters:  01/09/23 22.49 kg/m  01/07/23 22.49 kg/m  08/15/21 22.13 kg/m  06/05/21 23.86 kg/m   Wt Readings from Last 4 Encounters:  01/09/23 131 lb (59.4 kg)  01/07/23 131 lb (59.4 kg)  08/15/21 133 lb (60.3 kg)  06/05/21 139 lb (63 kg)    Psych/Mental status: Alert, oriented x 3 (person, place, & time)       Eyes: PERLA Respiratory: No evidence of acute respiratory distress  Lumbar Spine Area Exam  Skin & Axial  Inspection: No masses, redness, or swelling Alignment: Symmetrical Functional ROM: Unrestricted ROM       Stability: No instability detected Muscle Tone/Strength: Functionally intact. No obvious neuro-muscular anomalies detected. Sensory (Neurological): Unimpaired Palpation: No palpable anomalies       Provocative Tests: Hyperextension/rotation test: deferred today       Lumbar quadrant test (Kemp's test): deferred today       Lateral bending test: deferred today       Patrick's Maneuver: deferred today                   FABER* test: deferred today                   S-I anterior distraction/compression test: deferred today         S-I lateral compression test: deferred today         S-I Thigh-thrust test: deferred today         S-I Gaenslen's test: deferred today         *(Flexion, ABduction and External Rotation) Gait & Posture Assessment  Ambulation: Unassisted Gait: Relatively normal for age and body habitus Posture: WNL  Lower Extremity Exam    Side: Right lower extremity  Side: Left lower extremity  Stability: No instability observed          Stability: No instability observed          Skin & Extremity Inspection: Skin color, temperature, and hair growth are WNL. No peripheral edema or cyanosis. No masses, redness, swelling, asymmetry, or associated skin lesions. No contractures.  Skin & Extremity Inspection: Positive color changes, temperature change, sensitivity  Functional ROM: Unrestricted ROM                  Functional ROM: pain restricted ROM of left ankle and foot          Muscle Tone/Strength: Functionally intact. No obvious neuro-muscular anomalies detected.  Muscle Tone/Strength: Functionally intact. No obvious neuro-muscular anomalies detected.  Sensory (Neurological): Unimpaired        Sensory (Neurological): Neurogenic pain pattern        DTR: Patellar: deferred today Achilles: deferred today Plantar: deferred today  DTR: Patellar: deferred today Achilles:  deferred today Plantar: deferred today  Palpation: No palpable anomalies  Palpation: No palpable anomalies    Assessment  Primary Diagnosis & Pertinent Problem List: The primary encounter diagnosis was Complex regional pain syndrome type 1 of left lower extremity. 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  Visit Diagnosis (New problems to examiner): 1. Complex regional pain syndrome type 1 of left lower extremity   2. Intractable neuropathic pain of foot, left   3. 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 Chronic pain syndrome    Plan of Care (Initial workup plan)  I discussed various treatment options for her condition.  For her complex regional pain syndrome, we discussed left L3 lumbar sympathetic nerve block.  I explained this procedure to her in great detail and provided her with information regarding its efficacy for complex regional pain syndrome.  I was also able to evaluate her thoracic or lumbar spine under live fluoroscopy to determine candidacy for spinal cord stimulator trial.  We had an extensive discussion regarding spinal cord stimulation.  Patient was well read on spinal cord stimulation and what it entails.  She does have patent interlaminar windows for percutaneous lead access.  We discussed the risks and benefits of spinal cord stimulator trial.  She has exhausted conservative measures including physical therapy, neuropathic pharmacologic's including gabapentin, Lyrica, currently on nortriptyline.  She states that the pain is very debilitating and limits her ADLs.  She would like to start with lumbar sympathetic nerve block which is reasonable.  Imaging Orders         DG PAIN CLINIC C-ARM 1-60 MIN NO REPORT      Procedure Orders         LUMBAR SYMPATHETIC BLOCK     Provider-requested follow-up:  Return in about 27 days (around 02/05/2023) for Left L3 LSB , in clinic IV Versed.  Future Appointments  Date Time Provider Department Center  01/10/2023  9:00 AM Felecia Shelling, DPM [id number]  01/15/2023 10:40 AM [facility]  01/15/2023 11:00 AM [facility]US 1 [facility]    Duration of encounter: .  Total time on encounter, as per AMA guidelines included both the face-to-face and non-face-to-face time personally spent by the physician and/or other qualified health care professional(s) on the day of the encounter (includes time in activities that require the physician or other qualified health care professional and does not include time in activities normally performed by clinical staff). Physician's time may include the following activities when performed: Preparing to see the patient (e.g., pre-charting review of records, searching for previously ordered imaging, lab work, and nerve conduction tests) Review of prior analgesic pharmacotherapies. Reviewing PMP Interpreting ordered tests (e.g., lab work, imaging, nerve conduction tests) Performing post-procedure evaluations, including interpretation of diagnostic procedures Obtaining and/or reviewing separately obtained history Performing a medically appropriate examination and/or evaluation Counseling and educating the patient/family/caregiver Ordering medications, tests, or procedures Referring and communicating with other health care professionals (when not separately reported) Documenting clinical information in the electronic or other health record Independently interpreting results (not separately reported) and communicating results to the patient/ family/caregiver Care coordination (not separately reported)  Note by: Edward Jolly, MD (TTS technology used. I apologize for any typographical errors that were not detected and corrected.) Date: 01/09/2023; Time: 3:30 PM

## 2023-01-09 NOTE — Patient Instructions (Addendum)
Please get [place] MRIs of your lumbar spine and/or thoracic spine Follow up for left L3 LSB  Procedure instructions  Do not eat or drink fluids (other than water) for 6 hours before your procedure  No water for 2 hours before your procedure  Take your blood pressure medicine with a sip of water  Arrive 30 minutes before your appointment  Carefully read the "Preparing for your procedure" detailed instructions  If you have questions call us at [phone]  _____________________________________________________________________    ______________________________________________________________________  Preparing for your procedure  Appointments: If you think you may not be able to keep your appointment, call 24-48 hours in advance to cancel. We need time to make it available to others.  During your procedure appointment there will be: No Prescription Refills. No disability issues to discussed. No medication changes or discussions.  Instructions: Food intake: Avoid eating anything solid for at least 8 hours prior to your procedure. Clear liquid intake: You may take clear liquids such as water up to 2 hours prior to your procedure. (No carbonated drinks. No soda.) Transportation: Unless otherwise stated by your physician, bring a driver. Morning Medicines: Except for blood thinners, take all of your other morning medications with a sip of water. Make sure to take your heart and blood pressure medicines. If your blood pressure's lower number is above 100, the case will be rescheduled. Blood thinners: Make sure to stop your blood thinners as instructed.  If you take a blood thinner, but were not instructed to stop it, call our office [phone] and ask to talk to a nurse. Not stopping a blood thinner prior to certain procedures could lead to serious complications. Diabetics on insulin: Notify the staff so that you can be scheduled 1st case in the morning. If your diabetes requires high  dose insulin, take only  of your normal insulin dose the morning of the procedure and notify the staff that you have done so. Preventing infections: Shower with an antibacterial soap the morning of your procedure.  Build-up your immune system: Take 1000 mg of Vitamin C with every meal (3 times a day) the day prior to your procedure. Antibiotics: Inform the nursing staff if you are taking any antibiotics or if you have any conditions that may require antibiotics prior to procedures. (Example: recent joint implants)   Pregnancy: If you are pregnant make sure to notify the nursing staff. Not doing so may result in injury to the fetus, including death.  Sickness: If you have a cold, fever, or any active infections, call and cancel or reschedule your procedure. Receiving steroids while having an infection may result in complications. Arrival: You must be in the facility at least 30 minutes prior to your scheduled procedure. Tardiness: Your scheduled time is also the cutoff time. If you do not arrive at least 15 minutes prior to your procedure, you will be rescheduled.  Children: Do not bring any children with you. Make arrangements to keep them home. Dress appropriately: There is always a possibility that your clothing may get soiled. Avoid long dresses. Valuables: Do not bring any jewelry or valuables.  Reasons to call and reschedule or cancel your procedure: (Following these recommendations will minimize the risk of a serious complication.) Surgeries: Avoid having procedures within 2 weeks of any surgery. (Avoid for 2 weeks before or after any surgery). Flu Shots: Avoid having procedures within 2 weeks of a flu shots or . (Avoid for 2 weeks before or after immunizations). Barium: Avoid  having a procedure within 7-10 days after having had a radiological study involving the use of radiological contrast. (Myelograms, Barium swallow or enema study). Heart attacks: Avoid any elective procedures or surgeries  for the initial 6 months after a "Myocardial Infarction" (Heart Attack). Blood thinners: It is imperative that you stop these medications before procedures. Let us know if you if you take any blood thinner.  Infection: Avoid procedures during or within two weeks of an infection (including chest colds or gastrointestinal problems). Symptoms associated with infections include: Localized redness, fever, chills, night sweats or profuse sweating, burning sensation when voiding, cough, congestion, stuffiness, runny nose, sore throat, diarrhea, nausea, vomiting, cold or Flu symptoms, recent or current infections. It is specially important if the infection is over the area that we intend to treat. Heart and lung problems: Symptoms that may suggest an active cardiopulmonary problem include: cough, chest pain, breathing difficulties or shortness of breath, dizziness, ankle swelling, uncontrolled high or unusually low blood pressure, and/or palpitations. If you are experiencing any of these symptoms, cancel your procedure and contact your primary care physician for an evaluation.  Remember:  Regular Business hours are:  Monday to Thursday 8:00 AM to 4:00 PM  Provider's Schedule: Delano Metz, MD:  Procedure days: Tuesday and Thursday 7:30 AM to 4:00 PM  Edward Jolly, MD:  Procedure days: Monday and Wednesday 7:30 AM to 4:00 PM

## 2023-01-10 ENCOUNTER — Ambulatory Visit: Payer: No Typology Code available for payment source

## 2023-01-10 ENCOUNTER — Ambulatory Visit (INDEPENDENT_AMBULATORY_CARE_PROVIDER_SITE_OTHER): Payer: No Typology Code available for payment source | Admitting: Podiatry

## 2023-01-10 DIAGNOSIS — M79671 Pain in right foot: Secondary | ICD-10-CM

## 2023-01-10 DIAGNOSIS — G90522 Complex regional pain syndrome I of left lower limb: Secondary | ICD-10-CM | POA: Diagnosis not present

## 2023-01-10 NOTE — Progress Notes (Addendum)
Chief Complaint  Patient presents with   Peripheral Neuropathy    Patient came in today for Neuropathy, started 2016, sharp pain shooting going up the foot, stabbing burning, hallux and 2nd toes have to be separated, turns red and gets hot, rate of pain 11, out of 10,  Back pain,     HPI: 52 y.o. 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  Patient states that she began to develop neuropathy in 2016.  It became more exacerbated around 2020.  She is seen multiple physicians for her pain and was diagnosed officially with CRPS in 2022.  Currently on pain management.  She also has a history of lumbar radiculopathy.  Patient states that she has had multiple tests including noninvasive vascular studies, nerve conduction studies and has been evaluated by multiple physicians.  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   Allergy    Asthma    BRCA negative 04/2020   MyRisk neg except MSH6 VUS   Carpal tunnel syndrome    Complex regional pain syndrome I    Family history of breast cancer    Family history of kidney cancer    Family history of ovarian cancer    Genital herpes    Genital warts    Hematuria    Increased risk of breast cancer 04/2020   IBIS=15.2%/riskscore=21.4%   Peripheral neuropathy 2020   Screening for colon cancer 07/2021   NEG cologuard, repeat after 3 yrs   Vitamin D deficiency     Past Surgical History:  Procedure Laterality Date   CERVICAL BIOPSY  W/ LOOP ELECTRODE EXCISION  2010   LGSIL benign pathology    Allergies  Allergen Reactions   Gabapentin Other (See Comments)    Sweating Sweating Sweating with a number of other side effects      Physical Exam: General: The patient is alert and oriented x3 in no acute distress.  Dermatology: Skin is warm, dry and supple  bilateral lower extremities.  Left foot is cool to touch compared to the contralateral limb.  Vascular: Palpable pedal pulses bilaterally.  Capillary refill slightly delayed left lower extremity  Neurological: Grossly intact via light touch.  Hypersensitivity with light touch  Musculoskeletal Exam: No pedal deformities noted.  There is pain with light touch and palpation to the left foot.  No pinpoint or focal area of pain.  This is diffuse pain throughout the entire foot and ankle extending up into the leg  Radiographic Exam LT foot 05/17/2021:  FINDINGS: There is no evidence of fracture or dislocation. There is no evidence of arthropathy or other focal bone abnormality. Soft tissues are unremarkable.   IMPRESSION: Negative.  MR FOOT LT W WO CONTRAST 02/20/2021 IMPRESSION: Mild focal posterior tibial tendon tenosynovitis above the medial malleolus. Somewhat diminutive appearance of the inframalleolar peroneal brevis tendon, which is intact distally.  No other findings to explain neuropathic pain.  Assessment/Plan of Care: 1.  CRPS left lower extremity.  Diagnosed 2022 2.  Generalized diffuse pain left foot and ankle  -Patient evaluated.  Patient's chart reviewed -There is no distinct focal area of pain.  The pain and hypersensitivity to the foot is diffuse -Continue pain management with Dr. Edward Jolly, MD -Recommend daily range of motion exercises -Recommend topical anti-inflammatory diclofenac available OTC as needed -  Recommend good supportive shoes and sneakers.  Advised against going barefoot -Return to clinic as needed       Felecia Shelling, DPM [facility]  Dr. Felecia Shelling, DPM    [address] [place], [place] [address]                Office [phone]  Fax [phone]

## 2023-01-14 ENCOUNTER — Telehealth: Payer: Self-pay | Admitting: Student in an Organized Health Care Education/Training Program

## 2023-01-14 NOTE — Telephone Encounter (Signed)
Called patient. She states that she does not have kidney disease and that on the Dr. Note of the new patient questionnaire is was put in as kidney disease. Spoke with Chip Boer and she is going to let me know how to resolve this for the patient. I will keep patient updated. Patient aware.

## 2023-01-14 NOTE — Telephone Encounter (Signed)
PT called stated that in her notes it was type that she has kidney disease , PT asked for this to be taking out PT stated that she doesn't have kidney disease will ike it remove out of her chart. TY

## 2023-01-14 NOTE — Telephone Encounter (Signed)
Chart fixed per Dr. Cherylann Ratel. Patient called and made aware.

## 2023-01-15 ENCOUNTER — Ambulatory Visit
Admission: RE | Admit: 2023-01-15 | Discharge: 2023-01-15 | Disposition: A | Discharge status: Home / Self Care | Payer: No Typology Code available for payment source | Source: Ambulatory Visit | Attending: Obstetrics and Gynecology

## 2023-01-15 ENCOUNTER — Other Ambulatory Visit: Payer: Self-pay | Admitting: Obstetrics and Gynecology

## 2023-01-15 ENCOUNTER — Ambulatory Visit
Admission: RE | Admit: 2023-01-15 | Discharge: 2023-01-15 | Disposition: A | Discharge status: Home / Self Care | Payer: No Typology Code available for payment source | Source: Ambulatory Visit | Attending: Obstetrics and Gynecology | Admitting: Obstetrics and Gynecology

## 2023-01-15 DIAGNOSIS — R928 Other abnormal and inconclusive findings on diagnostic imaging of breast: Secondary | ICD-10-CM

## 2023-01-15 DIAGNOSIS — Z1231 Encounter for screening mammogram for malignant neoplasm of breast: Secondary | ICD-10-CM | POA: Diagnosis not present

## 2023-01-15 DIAGNOSIS — Z803 Family history of malignant neoplasm of breast: Secondary | ICD-10-CM | POA: Diagnosis present

## 2023-01-15 DIAGNOSIS — N6311 Unspecified lump in the right breast, upper outer quadrant: Secondary | ICD-10-CM

## 2023-01-15 DIAGNOSIS — R599 Enlarged lymph nodes, unspecified: Secondary | ICD-10-CM

## 2023-01-15 DIAGNOSIS — N63 Unspecified lump in unspecified breast: Secondary | ICD-10-CM

## 2023-01-15 DIAGNOSIS — Z9189 Other specified personal risk factors, not elsewhere classified: Secondary | ICD-10-CM

## 2023-01-21 ENCOUNTER — Ambulatory Visit
Admission: RE | Admit: 2023-01-21 | Discharge: 2023-01-21 | Disposition: A | Discharge status: Home / Self Care | Payer: No Typology Code available for payment source | Source: Ambulatory Visit | Attending: Obstetrics and Gynecology

## 2023-01-21 ENCOUNTER — Ambulatory Visit
Admission: RE | Admit: 2023-01-21 | Discharge: 2023-01-21 | Disposition: A | Discharge status: Home / Self Care | Payer: No Typology Code available for payment source | Source: Ambulatory Visit | Attending: Obstetrics and Gynecology | Admitting: Obstetrics and Gynecology

## 2023-01-21 DIAGNOSIS — R599 Enlarged lymph nodes, unspecified: Secondary | ICD-10-CM | POA: Insufficient documentation

## 2023-01-21 DIAGNOSIS — R928 Other abnormal and inconclusive findings on diagnostic imaging of breast: Secondary | ICD-10-CM

## 2023-01-21 DIAGNOSIS — N63 Unspecified lump in unspecified breast: Secondary | ICD-10-CM | POA: Insufficient documentation

## 2023-01-21 HISTORY — PX: BREAST BIOPSY: SHX20

## 2023-01-21 MED ORDER — LIDOCAINE HCL 1 % IJ SOLN
2.0000 mL | Freq: Once | INTRAMUSCULAR | Status: AC
  Administered 2023-01-21: 2 mL via INTRADERMAL
  Filled 2023-01-21: qty 2

## 2023-01-21 MED ORDER — LIDOCAINE-EPINEPHRINE 1 %-1:100000 IJ SOLN
8.0000 mL | Freq: Once | INTRAMUSCULAR | Status: AC
  Administered 2023-01-21: 8 mL
  Filled 2023-01-21: qty 8

## 2023-01-22 ENCOUNTER — Telehealth: Payer: Self-pay | Admitting: Podiatry

## 2023-01-22 NOTE — Telephone Encounter (Signed)
Pt called and is wanting to make sure you complete the note from the visit with her from 5.24.2024. She seen you in [place] office and said you had a lot of recommendations but they are not included in your note. It appears you started to put stuff in there assessment/plan of care area but only put the number 1. She is wanting to reference that so she can make sure she is using your recommendations. She stated she had left a message on the nurse line in [place] after her appt but no one got back to her. Please advise when done and I will call pt.

## 2023-01-23 ENCOUNTER — Telehealth: Payer: Self-pay | Admitting: Obstetrics and Gynecology

## 2023-01-23 LAB — SURGICAL PATHOLOGY

## 2023-01-23 NOTE — Telephone Encounter (Signed)
Patient called requesting you to send in referrals for Surgical and Oncology services.  Patient has breast cancer ans the results are in Epic.  Please advise the patient of the next steps

## 2023-01-23 NOTE — Telephone Encounter (Signed)
Pls let pt know [facility] will call her to go over the results and set up the surg and onc appts for her. I will call her next wk when I'm back in the office. Thx

## 2023-01-24 NOTE — Telephone Encounter (Signed)
Called the patient and she stated that [facility] has already reached out to her and they would be contacting you(Alicia Copland) as well.

## 2023-01-27 ENCOUNTER — Telehealth: Payer: Self-pay | Admitting: Obstetrics and Gynecology

## 2023-01-27 ENCOUNTER — Encounter: Payer: Self-pay | Admitting: Obstetrics and Gynecology

## 2023-01-27 NOTE — Telephone Encounter (Signed)
Spoke with pt.  Has onc and surg appts with [facility]. Pt upset about dx. Will cont to follow her mgmt.

## 2023-01-27 NOTE — Telephone Encounter (Signed)
The patient is returning missed call. Please advise? 

## 2023-01-29 NOTE — Telephone Encounter (Signed)
Left message for pt that note was complete and she should be acle to see in mychart but if there an issue seeing it to let us know and we will see how we can get it to pt.

## 2023-03-06 ENCOUNTER — Encounter: Payer: Self-pay | Admitting: Obstetrics and Gynecology

## 2023-03-17 ENCOUNTER — Ambulatory Visit
Payer: No Typology Code available for payment source | Admitting: Student in an Organized Health Care Education/Training Program

## 2023-03-27 ENCOUNTER — Other Ambulatory Visit: Payer: Self-pay | Admitting: Obstetrics and Gynecology

## 2023-04-10 ENCOUNTER — Telehealth: Payer: Self-pay | Admitting: Student in an Organized Health Care Education/Training Program

## 2023-04-10 NOTE — Telephone Encounter (Signed)
Thanks

## 2023-04-10 NOTE — Telephone Encounter (Signed)
Patient called to reschedule her 9-4 procedure appt. She just received a cancer diagnosis and found out she has to start Radiation. She wants to put off injections until the end of October or early November. It will have to be authorized again. She will call a couple weeks before she wants to have injections so authorization may be obtained.

## 2023-04-15 IMAGING — CR DG SHOULDER 2+V*L*
1 series · 4 of 4 positions shown · non-contrast
Comparison: None.

CLINICAL DATA: Left shoulder pain.  No injury.

EXAM:
LEFT SHOULDER - 2+ VIEW

[Series 1: dg shoulder left · 0.14mm/px · 4 of 4 slices shown]
[im 1/4]
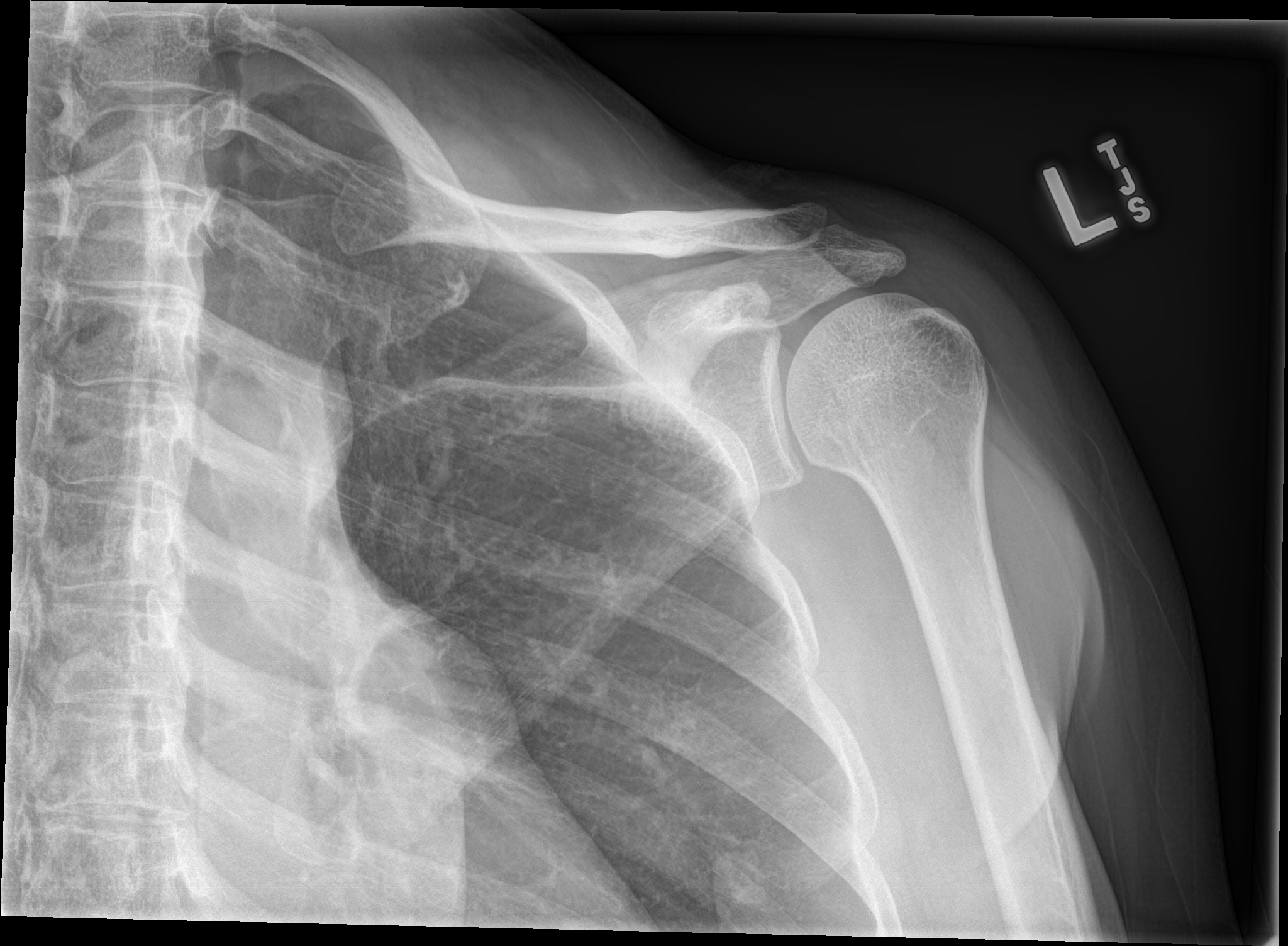
[im 2/4]
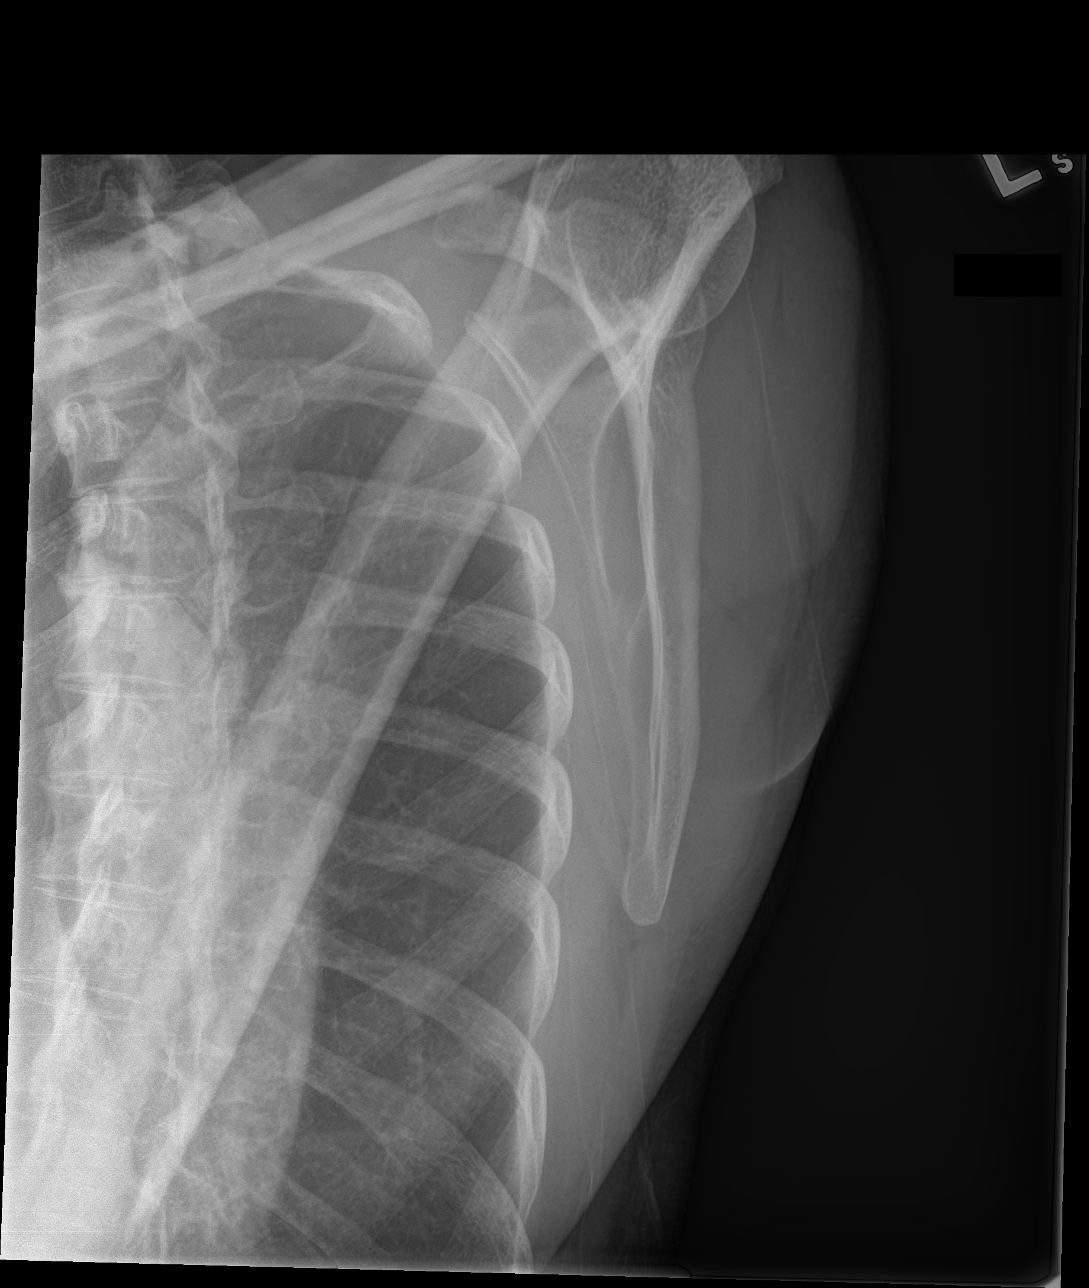
[im 3/4]
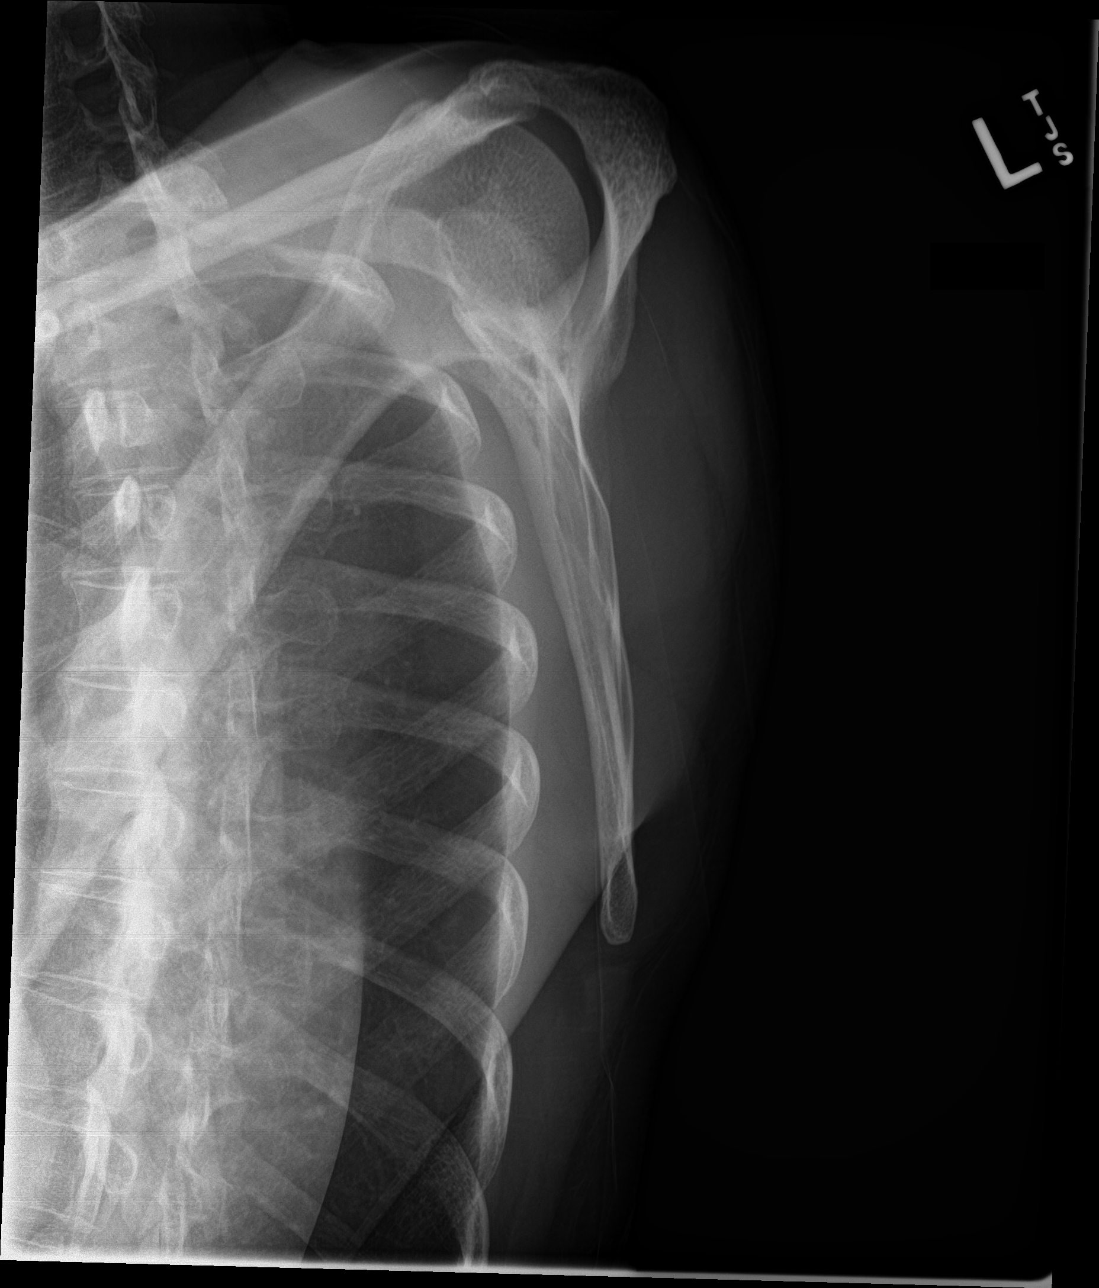
[im 4/4]
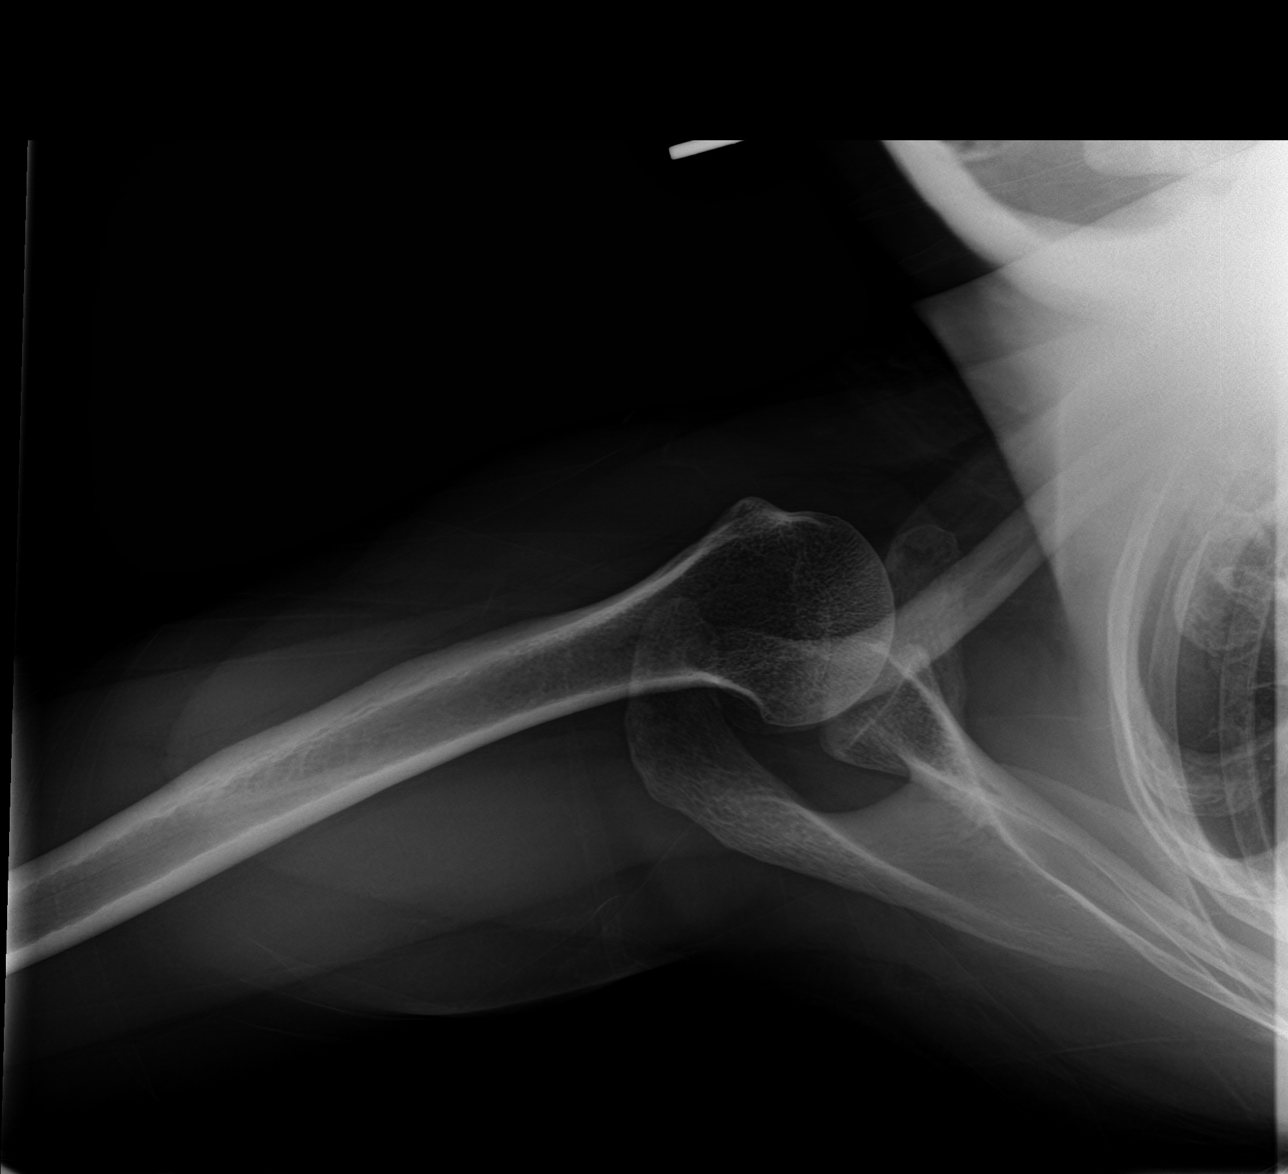

[4 of 4 positions shown; findings below may reference images not displayed]

FINDINGS: There is no evidence of fracture or dislocation. There is no
evidence of arthropathy or other focal bone abnormality. Soft
tissues are unremarkable.
IMPRESSION: Negative.

## 2023-04-23 ENCOUNTER — Ambulatory Visit
Payer: No Typology Code available for payment source | Admitting: Student in an Organized Health Care Education/Training Program

## 2023-04-25 ENCOUNTER — Other Ambulatory Visit: Payer: Self-pay | Admitting: Neurology

## 2023-04-25 DIAGNOSIS — R4189 Other symptoms and signs involving cognitive functions and awareness: Secondary | ICD-10-CM

## 2023-06-23 ENCOUNTER — Other Ambulatory Visit
Admission: RE | Admit: 2023-06-23 | Discharge: 2023-06-23 | Disposition: A | Discharge status: Home / Self Care | Payer: Medicaid Other | Source: Ambulatory Visit | Attending: Specialist | Admitting: Specialist

## 2023-06-23 DIAGNOSIS — R0602 Shortness of breath: Secondary | ICD-10-CM | POA: Diagnosis present

## 2023-06-23 DIAGNOSIS — C50011 Malignant neoplasm of nipple and areola, right female breast: Secondary | ICD-10-CM | POA: Diagnosis present

## 2023-06-23 LAB — D-DIMER, QUANTITATIVE: D-Dimer, Quant: 0.27 ug{FEU}/mL (ref 0.00–0.50)

## 2023-07-10 ENCOUNTER — Encounter: Payer: Self-pay | Admitting: Occupational Therapy

## 2023-07-10 ENCOUNTER — Ambulatory Visit: Payer: Medicaid Other | Attending: Internal Medicine | Admitting: Occupational Therapy

## 2023-07-10 DIAGNOSIS — M79601 Pain in right arm: Secondary | ICD-10-CM | POA: Insufficient documentation

## 2023-07-10 DIAGNOSIS — M25611 Stiffness of right shoulder, not elsewhere classified: Secondary | ICD-10-CM | POA: Diagnosis present

## 2023-07-10 DIAGNOSIS — L905 Scar conditions and fibrosis of skin: Secondary | ICD-10-CM | POA: Diagnosis present

## 2023-07-10 DIAGNOSIS — I890 Lymphedema, not elsewhere classified: Secondary | ICD-10-CM | POA: Insufficient documentation

## 2023-07-10 NOTE — Therapy (Addendum)
OUTPATIENT OCCUPATIONAL THERAPY BREAST CANCER POST OP Evaluation   Patient Name: Stacey Morris MRN: [id number] DOB:12/15/1970, 52 y.o., female Today's Date: 07/10/2023  END OF SESSION:  OT End of Session - 07/10/23 1929     Visit Number 1    Number of Visits 12    Date for OT Re-Evaluation 10/02/23    OT Start Time 1446    OT Stop Time 1533    OT Time Calculation (min) 47 min    Activity Tolerance Patient tolerated treatment well    Behavior During Therapy [facility] for tasks assessed/performed             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   Allergy    Asthma    BRCA negative 04/2020   MyRisk neg except MSH6 VUS   Carpal tunnel syndrome    Complex regional pain syndrome I    Family history of breast cancer    Family history of kidney cancer    Family history of ovarian cancer    Genital herpes    Genital warts    Hematuria    Increased risk of breast cancer 04/2020   IBIS=15.2%/riskscore=21.4%   Peripheral neuropathy 2020   Screening for colon cancer 07/2021   NEG cologuard, repeat after 3 yrs   Vitamin D deficiency    Past Surgical History:  Procedure Laterality Date   BREAST BIOPSY Right 01/21/2023   U?S BX,heart clip path pending   BREAST BIOPSY Right 01/21/2023   u/s bx, axilla COIL clip-path penmding   BREAST BIOPSY Right 01/21/2023   [place] RT BREAST BX W LOC DEV 1ST LESION IMG BX SPEC US GUIDE 01/21/2023 [facility]   CERVICAL BIOPSY  W/ LOOP ELECTRODE EXCISION  2010   LGSIL benign pathology   Patient Active Problem List   Diagnosis Date Noted   Intractable neuropathic pain of foot, left 01/09/2023   DDD (degenerative disc disease), cervical 08/15/2021   Tenosynovitis of tibialis posterior tendon (Left) 08/15/2021   History of cervical dysplasia 06/05/2021   Family history of breast cancer 06/05/2021   Increased risk of breast cancer 06/05/2021   Metatarsalgia of foot (Left)  05/18/2021   Tenosynovitis of ankle (Left) 05/18/2021   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) 05/18/2021   Chronic pain syndrome 05/16/2021   Pharmacologic therapy 05/16/2021   Disorder of skeletal system 05/16/2021   Problems influencing health status 05/16/2021   Chronic foot pain (1ry area of Pain) (Left) 05/16/2021   Chronic lower extremity pain (2ry area of Pain) (Left) 05/16/2021   Chronic knee pain (3ry area of Pain) (Bilateral) (L>R) 05/16/2021   Chronic neck pain (5th area of Pain) (Midline) 05/16/2021   Cervicogenic headache 05/16/2021   Occipital headache (Bilateral) 05/16/2021   Greater occipital neuralgia (Bilateral) 05/16/2021   Chronic shoulder pain (6th area of Pain) (Bilateral) 05/16/2021   Carpal tunnel syndrome (Right) 05/16/2021   H/O degenerative disc disease 05/15/2021   Complex regional pain syndrome type 1 of left lower extremity 03/11/2021   Genital herpes 05/05/2020   Hematuria, microscopic 05/05/2020   Asthma, stable, mild intermittent 05/05/2020   Family history of ovarian cancer 04/26/2020   Family history of kidney cancer 09/20/2019   Asthma, stable, moderate persistent 06/21/2019   Vitamin D insufficiency 05/28/2019   Insomnia 05/25/2019   Low serum vitamin D 05/25/2019   Major depressive disorder, recurrent, in partial remission (HCC) 05/25/2019  Intramural and subserous leiomyoma of uterus 02/25/2019   DDD (degenerative disc disease), lumbosacral 02/26/2018   Lumbar radiculopathy 02/26/2018   Seasonal allergic rhinitis 02/26/2018   RAD (reactive airway disease), moderate persistent, uncomplicated 02/26/2018   Asthma    Allergies    Chronic low back pain (4th area of Pain) (Bilateral) (R>L) w/o sciatica 01/30/2015   Abnormal Pap smear of cervix 08/19/2008    PCP: Dr Marcello Fennel  REFERRING PROVIDER: Dr Marcello Fennel  REFERRING DIAG: R breast CA with radiation/lumpectomy   THERAPY DIAG:  Scar condition and fibrosis of  skin  Lymphedema, not elsewhere classified  Pain in right arm  Stiffness of right shoulder, not elsewhere classified  Rationale for Evaluation and Treatment: Rehabilitation  ONSET DATE: 02/18/23  SUBJECTIVE:                                                                                                                                                                                           SUBJECTIVE STATEMENT: I have this lump on my right breast where the incision was since after surgery.  But got harder and more tender after radiation.  2 ultrasound showed seroma but the last 1 showed blocked duct.  Did see physical therapist  at [facility] for a few sessions prior to radiation to get my motion.  I had lymphatic cording after surgery.  And then I still just have so much pain, tightness, stiffness in my right arm limiting my shoulder motion in use.  PERTINENT HISTORY: ONCOLOGY 06/20/23 ASSESSMENT & DISCUSSION   This is a 52 y.o. 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  Ms. Baumel had recurrent R UOQ seroma which is tender and exacerbated after completing radiation. She has limited R arm ROM Rx given for PT,IB to [facility] to find local provider in [place]. She was given Rx for compression sleeve as recommended by current PT.   PLAN   No additional surgery is recommended at this time Next bilateral contrast enhanced mammogram due 01/2024 Return 01/2024 for a follow-up office visit and breast exam with same day mammogram Recommend PT with [facility] provider to improve R arm ROM and lymphedema of R breast/arm.  May use compression sleeve daily in daytime for R arm lymphedema.  Paul Half   PATIENT GOALS:  Reassess how my recovery is going  related to arm function, pain, and swelling.  PAIN:  Are you having pain? 5/10 right breast  as well as arm  PRECAUTIONS: Recent Surgery, R  UE Lymphedema risk,     ACTIVITY LEVEL / LEISURE: Used to walk in the mall.  85 year old daughter lives with her.  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   OBJECTIVE:    OBSERVATIONS: Patient with rounded shoulder on the right more than the left.  Guarding her right arm carrying a close to body.  Because of pain.  Some slow deliberate motion for shoulder motion as well as elbow.  POSTURE:  Rounded shoulders.  LYMPHEDEMA ASSESSMENT:     [facility] OT Assessment - 07/10/23 0001       AROM   Right Shoulder Flexion 145 Degrees    Right Shoulder ABduction 100 Degrees    Right Shoulder External Rotation 75 Degrees             LYMPHEDEMA/ONCOLOGY QUESTIONNAIRE - 07/10/23 0001       Right Upper Extremity Lymphedema   15 cm Proximal to Olecranon Process 27.4 cm    10 cm Proximal to Olecranon Process 27.4 cm    Olecranon Process 22 cm    15 cm Proximal to Ulnar Styloid Process 22 cm    10 cm Proximal to Ulnar Styloid Process 18.4 cm    Just Proximal to Ulnar Styloid Process 13.8 cm      Left Upper Extremity Lymphedema   15 cm Proximal to Olecranon Process 26.5 cm    10 cm Proximal to Olecranon Process 26.4 cm    Olecranon Process 22 cm    15 cm Proximal to Ulnar Styloid Process 21.4 cm    10 cm Proximal to Ulnar Styloid Process 18 cm    Just Proximal to Ulnar Styloid Process 13.8 cm             Surgery type/Date: 02/18/23 Number of lymph nodes removed: 2 Current/past treatment (radiation, hormone therapy): yes Other symptoms:  Heaviness/tightness Yes Pain Yes Pitting edema No Infections No Decreased scar mobility Yes lateal breast  Stemmer sign No PATIENT EDUCATION: Education details: findings of eval and HEP  Person educated: Patient Education method: Explanation,  Demonstration, Tactile cues, Verbal cues, and Handouts Education comprehension: verbalized understanding, returned demonstration, verbal cues required, and needs further education  HOME EXERCISE PROGRAM: Reviewed shoulder flexion and horizontal abduction using wand in supine. Right upper extremity external rotation combination with a shoulder flexion on doorframe sliding up. 2-3 times a day 12-15 reps Composite nerve glide as well as ulnar nerve glide 5 reps 3 times a day Chip bag fabricated for right breast to decrease fibrosis and scar tissue patient to wear as much as she can tolerate inside her bra. ASSESSMENT:  CLINICAL IMPRESSION: Patient is 4 and half months postop right lumpectomy followed by radiation.  Patient with a hard lump over incision on the right lateral breast that appeared to have shown in the past seroma but could not drain any fluid.  Last 1 per patient showed blocked duct.  Right breast fibrotic with scar tissue and tenderness and pain limiting shoulder range of motion.  Patient had lymphatic cording prior to radiation.  Patient limited in shoulder and elbow wrist active range of motion in all planes.  With increased pain 6/10 in axilla, upper arm into the volar elbow to wrist.  Patient do appear to have stage I lymphedema in right upper extremity and can benefit from a over-the-counter compression sleeve and gauntlet to wear during the day first 6 to  8 hours to assist in mobilization of lymphatic cording.  Chip bag was put fabricated for patient to use on the right lateral breast to decrease fibrosis.  Patient can benefit from a unilateral postmastectomy Jovi pack in the near future.  Patient limited in functional use of right upper extremity in ADLs and IADLs.  Patient do have a history of neuropathy, carpal tunnel and right upper extremity.  Has a history of complex regional in right foot.  Pt will benefit from skilled therapeutic intervention to improve on the following  deficits: Decreased knowledge of precautions, impaired UE functional use, pain, decreased ROM, postural dysfunction.   OT treatment/interventions: ADL/Self care home management, 97110-Therapeutic exercises, 97530- Therapeutic activity, 97535- Self Care, 19147- Manual therapy, Patient/Family education, Manual lymph drainage, Scar mobilization, and DME instructions   GOALS: Goals reviewed with patient? Yes  LONG TERM GOALS:  (STG=LTG)  GOALS Name Target Date  Goal status  1 Pt will demonstrate she has regained R  shoulder ROM and function post operatively to be independ in ADL's and IADL's - close to prior level of function  Baseline: AROM WFL - pt did had CTS and neuropathy  prior to tx  12 wks INITIAL  2 Fibrosis and scar tissue with tenderness on right breast decrease to less than 2/10.  BASELINE: Patient has a hard lump over incision since after surgery that got worse after radiation.  Tenderness and pain 4-6/10 limiting her range of motion at R shoulder and discomfort and tenderness over lateral breast into  pect and axilla  12 INITIAL  3 Patient to be independent and wearing correct compression sleeve and glove as well as compression for right breast to decrease lymphatic symptoms and prevent future infection and lymphatic cording. BASELINE: Patient had lymphatic cording prior to radiation on right axilla down to wrist, lump with fibrosis possible seroma- possible blocked duct at incision on right lateral breast with increased pain 4-6/10- no compression this far or garments used 12 INITIAL          PLAN:  OT FREQUENCY/DURATION: 1-2 x wk depending on progress- 12 wks   PLAN FOR NEXT SESSION: Review home program, progress with range of motion as well as progress with use of tobacco on fibrotic and painful scar tissue on lateral breast, and assess for fitting for compression sleeve and glove on right upper extremity    Scar massage You can begin gentle scar massage to you incision  sites. Gently place one hand on the incision and move the skin (without sliding on the skin) in various directions. Do this for a few minutes and then you can gently massage either coconut oil or vitamin E cream into the scars.  Compression garment You should continue wearing your compression bra until you feel like you no longer have swelling.  Home exercise Program Continue doing the exercises you were given until you feel like you can do them without feeling any tightness at the end.   Walking Program Studies show that 30 minutes of walking per day (fast enough to elevate your heart rate) can significantly reduce the risk of a cancer recurrence. If you can't walk due to other medical reasons, we encourage you to find another activity you could do (like a stationary bike or water exercise).  Posture After breast cancer surgery, people frequently sit with rounded shoulders posture because it puts their incisions on slack and feels better. If you sit like this and scar tissue forms in that position, you can become  very tight and have pain sitting or standing with good posture. Try to be aware of your posture and sit and stand up tall to heal properly.    Oletta Cohn, OTR/L,CLT 07/10/2023, 7:41 PM

## 2023-07-24 ENCOUNTER — Ambulatory Visit: Payer: Medicaid Other | Admitting: Occupational Therapy

## 2023-08-05 ENCOUNTER — Ambulatory Visit: Payer: Medicaid Other | Attending: Internal Medicine | Admitting: Occupational Therapy

## 2023-08-05 DIAGNOSIS — L905 Scar conditions and fibrosis of skin: Secondary | ICD-10-CM

## 2023-08-05 DIAGNOSIS — M79601 Pain in right arm: Secondary | ICD-10-CM

## 2023-08-05 DIAGNOSIS — M25611 Stiffness of right shoulder, not elsewhere classified: Secondary | ICD-10-CM

## 2023-08-05 DIAGNOSIS — I890 Lymphedema, not elsewhere classified: Secondary | ICD-10-CM

## 2023-08-08 NOTE — Therapy (Signed)
OUTPATIENT OCCUPATIONAL THERAPY BREAST CANCER TREATMENT NOTE   Patient Name: Stacey Morris MRN: [id number] DOB:March 08, 1971, 52 y.o., female   END OF SESSION:  OT End of Session - 08/10/23 0936     Visit Number 2    Number of Visits 12    Date for OT Re-Evaluation 10/02/23    OT Start Time 1615    OT Stop Time 1712    OT Time Calculation (min) 57 min    Activity Tolerance Patient tolerated treatment well    Behavior During Therapy Stacey Morris for tasks assessed/performed              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   Allergy    Asthma    BRCA negative 04/2020   MyRisk neg except MSH6 VUS   Carpal tunnel syndrome    Complex regional pain syndrome I    Family history of breast cancer    Family history of kidney cancer    Family history of ovarian cancer    Genital herpes    Genital warts    Hematuria    Increased risk of breast cancer 04/2020   IBIS=15.2%/riskscore=21.4%   Peripheral neuropathy 2020   Screening for colon cancer 07/2021   NEG cologuard, repeat after 3 yrs   Vitamin D deficiency    Past Surgical History:  Procedure Laterality Date   BREAST BIOPSY Right 01/21/2023   U?S BX,heart clip path pending   BREAST BIOPSY Right 01/21/2023   u/s bx, axilla COIL clip-path penmding   BREAST BIOPSY Right 01/21/2023   [place] RT BREAST BX W LOC DEV 1ST LESION IMG BX SPEC US GUIDE 01/21/2023 [facility]   CERVICAL BIOPSY  W/ LOOP ELECTRODE EXCISION  2010   LGSIL benign pathology   Patient Active Problem List   Diagnosis Date Noted   Intractable neuropathic pain of foot, left 01/09/2023   DDD (degenerative disc disease), cervical 08/15/2021   Tenosynovitis of tibialis posterior tendon (Left) 08/15/2021   History of cervical dysplasia 06/05/2021   Family history of breast cancer 06/05/2021   Increased risk of breast cancer 06/05/2021   Metatarsalgia of foot (Left) 05/18/2021   Tenosynovitis  of ankle (Left) 05/18/2021   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) 05/18/2021   Chronic pain syndrome 05/16/2021   Pharmacologic therapy 05/16/2021   Disorder of skeletal system 05/16/2021   Problems influencing health status 05/16/2021   Chronic foot pain (1ry area of Pain) (Left) 05/16/2021   Chronic lower extremity pain (2ry area of Pain) (Left) 05/16/2021   Chronic knee pain (3ry area of Pain) (Bilateral) (L>R) 05/16/2021   Chronic neck pain (5th area of Pain) (Midline) 05/16/2021   Cervicogenic headache 05/16/2021   Occipital headache (Bilateral) 05/16/2021   Greater occipital neuralgia (Bilateral) 05/16/2021   Chronic shoulder pain (6th area of Pain) (Bilateral) 05/16/2021   Carpal tunnel syndrome (Right) 05/16/2021   H/O degenerative disc disease 05/15/2021   Complex regional pain syndrome type 1 of left lower extremity 03/11/2021   Genital herpes 05/05/2020   Hematuria, microscopic 05/05/2020   Asthma, stable, mild intermittent 05/05/2020   Family history of ovarian cancer 04/26/2020   Family history of kidney cancer 09/20/2019   Asthma, stable, moderate persistent 06/21/2019   Vitamin D insufficiency 05/28/2019   Insomnia 05/25/2019   Low serum vitamin D 05/25/2019   Major depressive disorder, recurrent, in partial remission (HCC) 05/25/2019   Intramural and  subserous leiomyoma of uterus 02/25/2019   DDD (degenerative disc disease), lumbosacral 02/26/2018   Lumbar radiculopathy 02/26/2018   Seasonal allergic rhinitis 02/26/2018   RAD (reactive airway disease), moderate persistent, uncomplicated 02/26/2018   Asthma    Allergies    Chronic low back pain (4th area of Pain) (Bilateral) (R>L) w/o sciatica 01/30/2015   Abnormal Pap smear of cervix 08/19/2008    PCP: Dr Marcello Fennel  REFERRING PROVIDER: Dr Marcello Fennel  REFERRING DIAG: R breast CA with radiation/lumpectomy   THERAPY DIAG:  Scar condition and fibrosis of skin  Lymphedema, not elsewhere  classified  Pain in right arm  Stiffness of right shoulder, not elsewhere classified  Rationale for Evaluation and Treatment: Rehabilitation  ONSET DATE: 02/18/23  SUBJECTIVE:                                                                                                                                                                                           SUBJECTIVE STATEMENT: Pt reports she has the chip bag, not sure if it is helping.  Discussed use of compression bra, she has not been wearing it due to it feeling like it is cutting into her on the side.     PERTINENT HISTORY: ONCOLOGY 06/20/23 ASSESSMENT & DISCUSSION   This is a 52 y.o. 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  Stacey Morris had recurrent R UOQ seroma which is tender and exacerbated after completing radiation. She has limited R arm ROM Rx given for PT,IB to [facility] to find local provider in [place]. She was given Rx for compression sleeve as recommended by current PT.   PLAN   No additional surgery is recommended at this time Next bilateral contrast enhanced mammogram due 01/2024 Return 01/2024 for a follow-up office visit and breast exam with same day mammogram Recommend PT with [facility] provider to improve R arm ROM and lymphedema of R breast/arm.  May use compression sleeve daily in daytime for R arm lymphedema.  Stacey Morris   PATIENT GOALS:  Reassess how my recovery is going related to arm function, pain, and swelling.  PAIN:  Are you having pain? 5/10 right breast as well as arm  PRECAUTIONS: Recent Surgery, R  UE Lymphedema risk,     ACTIVITY LEVEL / LEISURE: Used to walk in the mall.  61 year old daughter lives with her.  Husband passed away-on  long-term disability used to work in HR on  payroll and disability claims.   OBJECTIVE:    OBSERVATIONS: Patient with rounded shoulder on the right more than the left.  Guarding her right arm carrying a close to body.  Because of pain.  Some slow deliberate motion for shoulder motion as well as elbow.  POSTURE:  Rounded shoulders.  LYMPHEDEMA ASSESSMENT:     Surgery type/Date: 02/18/23 Number of lymph nodes removed: 2 Current/past treatment (radiation, hormone therapy): yes Other symptoms:  Heaviness/tightness Yes Pain Yes Pitting edema No Infections No Decreased scar mobility Yes lateal breast  Stemmer sign No PATIENT EDUCATION: Education details: findings of eval and HEP  Person educated: Patient Education method: Explanation, Demonstration, Tactile cues, Verbal cues, and Handouts Education comprehension: verbalized understanding, returned demonstration, verbal cues required, and needs further education  TREATMENT: Pt seen this date for exercises in supine, working on Physiological scientist assistive range of motion.  Therapist applying light manual pressure as a counterforce to side of breast and at pectoralis area to decrease pain and feeling of tightness and stretch.  Exercises performed for shoulder flexion, ABD and moving into scaption pattern.  Added back the wand for these movements also while therapist performing counter stretch and patient tolerated well.   Recommended slow prolonged stretch when performing.   Recommend pt try her compression bra again with the chip bag in it to impact fibrotic areas and place the chip bag so the bra does not press into her side.   She plans to go by [employer] to see about a preventative off the shelf sleeve and ask her MD about manual lymphatic drainage and if its ok to proceed with it as a part of her therapy intervention.   HOME EXERCISE PROGRAM: Reviewed shoulder flexion and horizontal abduction using wand in supine (add scaption) Right upper  extremity external rotation combination with a shoulder flexion on doorframe sliding up. 2-3 times a day 12-15 reps Composite nerve glide as well as ulnar nerve glide 5 reps 3 times a day Chip bag fabricated for right breast to decrease fibrosis and scar tissue patient to wear as much as she can tolerate inside her bra (try with compression bra if she can tolerate) ASSESSMENT:  CLINICAL IMPRESSION: Patient is 4 and Morris months postop right lumpectomy followed by radiation.  Patient with a hard lump over incision on the right lateral breast that appeared to have shown in the past seroma but could not drain any fluid.  Last 1 per patient showed blocked duct.  Right breast fibrotic with scar tissue and tenderness and pain limiting shoulder range of motion.  Patient had lymphatic cording prior to radiation.  Patient limited in shoulder and elbow wrist active range of motion in all planes.  With increased pain 6/10 in axilla, upper arm into the volar elbow to wrist.  Patient appears to have stage I lymphedema in right upper extremity and can benefit from a over-the-counter compression sleeve and gauntlet to wear during the day first 6 to 8 hours to assist in mobilization of lymphatic cording.  Chip bag was put fabricated for patient to use on the right lateral breast to decrease fibrosis.  Patient can benefit from a unilateral postmastectomy Jovi pack in the near future.  Pt progressed well with exercises this date, modifications made with adding scaption, responded well to counterforce stretch from therapist while performing exercises.  Reinforced posture, keeping chest open to avoid further pectoralis tightness.  Pt to try using her compression bra with chip bag.  She is going to go  to local DME to purchase a preventative arm sleeve and gauntlet.  Will see how she responds and tolerates chip bag before getting a jovi pak.  Recommended she ask to see a sample of a jovi pak when checking on her sleeve to see the  shape, weight and density.  Patient limited in functional use of right upper extremity in ADLs and IADLs.  Patient with a history of neuropathy, carpal tunnel and right upper extremity.  Has a history of complex regional in right foot.  Pt will benefit from skilled therapeutic intervention to improve on the following deficits: Decreased knowledge of precautions, impaired UE functional use, pain, decreased ROM, postural dysfunction.   OT treatment/interventions: ADL/Self care home management, 97110-Therapeutic exercises, 97530- Therapeutic activity, 97535- Self Care, 16109- Manual therapy, Patient/Family education, Manual lymph drainage, Scar mobilization, and DME instructions   GOALS: Goals reviewed with patient? Yes  LONG TERM GOALS:  (STG=LTG)  GOALS Name Target Date  Goal status  1 Pt will demonstrate she has regained R  shoulder ROM and function post operatively to be independ in ADL's and IADL's - close to prior level of function  Baseline: AROM WFL - pt did had CTS and neuropathy  prior to tx  12 wks INITIAL  2 Fibrosis and scar tissue with tenderness on right breast decrease to less than 2/10.  BASELINE: Patient has a hard lump over incision since after surgery that got worse after radiation.  Tenderness and pain 4-6/10 limiting her range of motion at R shoulder and discomfort and tenderness over lateral breast into  pect and axilla  12 INITIAL  3 Patient to be independent and wearing correct compression sleeve and glove as well as compression for right breast to decrease lymphatic symptoms and prevent future infection and lymphatic cording. BASELINE: Patient had lymphatic cording prior to radiation on right axilla down to wrist, lump with fibrosis possible seroma- possible blocked duct at incision on right lateral breast with increased pain 4-6/10- no compression this far or garments used 12 INITIAL          PLAN:  OT FREQUENCY/DURATION: 1-2 x wk depending on progress- 12 wks    PLAN FOR NEXT SESSION: Review home program, progress with range of motion as well as progress with use of tobacco on fibrotic and painful scar tissue on lateral breast, and assess for fitting for compression sleeve and glove on right upper extremity    Scar massage You can begin gentle scar massage to you incision sites. Gently place one hand on the incision and move the skin (without sliding on the skin) in various directions. Do this for a few minutes and then you can gently massage either coconut oil or vitamin E cream into the scars.  Compression garment You should continue wearing your compression bra until you feel like you no longer have swelling.  Home exercise Program Continue doing the exercises you were given until you feel like you can do them without feeling any tightness at the end.   Walking Program Studies show that 30 minutes of walking per day (fast enough to elevate your heart rate) can significantly reduce the risk of a cancer recurrence. If you can't walk due to other medical reasons, we encourage you to find another activity you could do (like a stationary bike or water exercise).  Posture After breast cancer surgery, people frequently sit with rounded shoulders posture because it puts their incisions on slack and feels better. If you sit like this and scar  tissue forms in that position, you can become very tight and have pain sitting or standing with good posture. Try to be aware of your posture and sit and stand up tall to heal properly.    Chitara Clonch, OTR/L,CLT 08/10/2023, 9:37 AM

## 2023-08-19 ENCOUNTER — Ambulatory Visit: Payer: Medicaid Other | Admitting: Occupational Therapy

## 2023-08-26 ENCOUNTER — Ambulatory Visit: Payer: Medicaid Other | Admitting: Occupational Therapy

## 2023-08-28 ENCOUNTER — Ambulatory Visit: Payer: Medicaid Other | Attending: Internal Medicine | Admitting: Occupational Therapy

## 2023-08-28 DIAGNOSIS — I890 Lymphedema, not elsewhere classified: Secondary | ICD-10-CM | POA: Insufficient documentation

## 2023-08-28 DIAGNOSIS — L905 Scar conditions and fibrosis of skin: Secondary | ICD-10-CM | POA: Diagnosis present

## 2023-08-28 DIAGNOSIS — M25611 Stiffness of right shoulder, not elsewhere classified: Secondary | ICD-10-CM | POA: Diagnosis present

## 2023-08-28 DIAGNOSIS — M79601 Pain in right arm: Secondary | ICD-10-CM | POA: Insufficient documentation

## 2023-08-29 ENCOUNTER — Encounter: Payer: Self-pay | Admitting: Occupational Therapy

## 2023-08-29 NOTE — Therapy (Signed)
 OUTPATIENT OCCUPATIONAL THERAPY BREAST CANCER TREATMENT NOTE   Patient Name: Stacey Morris MRN: [id number] DOB:02/27/1971, 53 y.o., female   END OF SESSION:  OT End of Session - 08/29/23 1250     Visit Number 3    Number of Visits 12    Date for OT Re-Evaluation 10/02/23    OT Start Time 1615    OT Stop Time 1710    OT Time Calculation (min) 55 min    Activity Tolerance Patient tolerated treatment well    Behavior During Therapy [facility] for tasks assessed/performed              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   Allergy    Asthma    BRCA negative 04/2020   MyRisk neg except MSH6 VUS   Carpal tunnel syndrome    Complex regional pain syndrome I    Family history of breast cancer    Family history of kidney cancer    Family history of ovarian cancer    Genital herpes    Genital warts    Hematuria    Increased risk of breast cancer 04/2020   IBIS=15.2%/riskscore=21.4%   Peripheral neuropathy 2020   Screening for colon cancer 07/2021   NEG cologuard, repeat after 3 yrs   Vitamin D deficiency    Past Surgical History:  Procedure Laterality Date   BREAST BIOPSY Right 01/21/2023   U?S BX,heart clip path pending   BREAST BIOPSY Right 01/21/2023   u/s bx, axilla COIL clip-path penmding   BREAST BIOPSY Right 01/21/2023   US  RT BREAST BX W LOC DEV 1ST LESION IMG BX SPEC US  GUIDE 01/21/2023 [facility]   CERVICAL BIOPSY  W/ LOOP ELECTRODE EXCISION  2010   LGSIL benign pathology   Patient Active Problem List   Diagnosis Date Noted   Intractable neuropathic pain of foot, left 01/09/2023   DDD (degenerative disc disease), cervical 08/15/2021   Tenosynovitis of tibialis posterior tendon (Left) 08/15/2021   History of cervical dysplasia 06/05/2021   Family history of breast cancer 06/05/2021   Increased risk of breast cancer 06/05/2021   Metatarsalgia of foot (Left) 05/18/2021   Tenosynovitis  of ankle (Left) 05/18/2021   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) 05/18/2021   Chronic pain syndrome 05/16/2021   Pharmacologic therapy 05/16/2021   Disorder of skeletal system 05/16/2021   Problems influencing health status 05/16/2021   Chronic foot pain (1ry area of Pain) (Left) 05/16/2021   Chronic lower extremity pain (2ry area of Pain) (Left) 05/16/2021   Chronic knee pain (3ry area of Pain) (Bilateral) (L>R) 05/16/2021   Chronic neck pain (5th area of Pain) (Midline) 05/16/2021   Cervicogenic headache 05/16/2021   Occipital headache (Bilateral) 05/16/2021   Greater occipital neuralgia (Bilateral) 05/16/2021   Chronic shoulder pain (6th area of Pain) (Bilateral) 05/16/2021   Carpal tunnel syndrome (Right) 05/16/2021   H/O degenerative disc disease 05/15/2021   Complex regional pain syndrome type 1 of left lower extremity 03/11/2021   Genital herpes 05/05/2020   Hematuria, microscopic 05/05/2020   Asthma, stable, mild intermittent 05/05/2020   Family history of ovarian cancer 04/26/2020   Family history of kidney cancer 09/20/2019   Asthma, stable, moderate persistent 06/21/2019   Vitamin D insufficiency 05/28/2019   Insomnia 05/25/2019   Low serum vitamin D 05/25/2019   Major depressive disorder, recurrent, in partial remission (HCC) 05/25/2019   Intramural and  subserous leiomyoma of uterus 02/25/2019   DDD (degenerative disc disease), lumbosacral 02/26/2018   Lumbar radiculopathy 02/26/2018   Seasonal allergic rhinitis 02/26/2018   RAD (reactive airway disease), moderate persistent, uncomplicated 02/26/2018   Asthma    Allergies    Chronic low back pain (4th area of Pain) (Bilateral) (R>L) w/o sciatica 01/30/2015   Abnormal Pap smear of cervix 08/19/2008    PCP: Dr Sadie  REFERRING PROVIDER: Dr Sadie  REFERRING DIAG: R breast CA with radiation/lumpectomy   THERAPY DIAG:  Scar condition and fibrosis of skin  Lymphedema, not elsewhere  classified  Pain in right arm  Stiffness of right shoulder, not elsewhere classified  Rationale for Evaluation and Treatment: Rehabilitation  ONSET DATE: 02/18/23  SUBJECTIVE:                                                                                                                                                                                           SUBJECTIVE STATEMENT: Pt reports she has been wearing chip bag, still not sure if it works and she has been trying it in her compression bra however, compression bra now feels too big.  She has tried in her sports bra and it works well there.    PERTINENT HISTORY: ONCOLOGY 06/20/23 ASSESSMENT & DISCUSSION   This is a 53 y.o. 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  Ms. Zern had recurrent R UOQ seroma which is tender and exacerbated after completing radiation. She has limited R arm ROM Rx given for PT,IB to [facility] to find local provider in [place]. She was given Rx for compression sleeve as recommended by current PT.   PLAN   No additional surgery is recommended at this time Next bilateral contrast enhanced mammogram due 01/2024 Return 01/2024 for a follow-up office visit and breast exam with same day mammogram Recommend PT with [facility] provider to improve R arm ROM and lymphedema of R breast/arm.  May use compression sleeve daily in daytime for R arm lymphedema.  Christobal Car[employer] Lines   PATIENT GOALS:  Reassess how my recovery is going related to arm function, pain, and swelling.  PAIN:  Are you having pain? 5/10 right breast as well as arm  PRECAUTIONS: Recent Surgery, R  UE Lymphedema risk,     ACTIVITY LEVEL / LEISURE: Used to walk in the mall.  52 year old daughter  lives with her.  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   OBJECTIVE:    OBSERVATIONS: Patient with rounded shoulder on the right more than the left.  Guarding her right arm carrying a close to body.  Because of pain.  Some slow deliberate motion for shoulder motion as well as elbow.  POSTURE:  Rounded shoulders.  LYMPHEDEMA ASSESSMENT:   [facility] OT Assessment - 08/29/23 0001       AROM   Right Shoulder Flexion 132 Degrees    Right Shoulder ABduction 100 Degrees    Right Shoulder External Rotation 75 Degrees              LYMPHEDEMA/ONCOLOGY QUESTIONNAIRE - 08/29/23 0001       Right Upper Extremity Lymphedema   15 cm Proximal to Olecranon Process 27.5 cm    10 cm Proximal to Olecranon Process 27 cm    Olecranon Process 22 cm    15 cm Proximal to Ulnar Styloid Process 21.9 cm    10 cm Proximal to Ulnar Styloid Process 18.5 cm    Just Proximal to Ulnar Styloid Process 13.8 cm      Left Upper Extremity Lymphedema   15 cm Proximal to Olecranon Process 26.5 cm    10 cm Proximal to Olecranon Process 26.4 cm    Olecranon Process 22 cm    15 cm Proximal to Ulnar Styloid Process 21.4 cm    10 cm Proximal to Ulnar Styloid Process 18 cm    Just Proximal to Ulnar Styloid Process 13.8 cm            Surgery type/Date: 02/18/23 Number of lymph nodes removed: 2 Current/past treatment (radiation, hormone therapy): yes Other symptoms:  Heaviness/tightness Yes Pain Yes Pitting edema No Infections No Decreased scar mobility Yes lateal breast  Stemmer sign No PATIENT EDUCATION: Education details: findings of eval and HEP  Person educated: Patient Education method: Explanation, Demonstration, Tactile cues, Verbal cues, and Handouts Education comprehension: verbalized understanding, returned demonstration, verbal cues required, and needs further education  TREATMENT: Therapeutic Exercise: Pt seen for exercises in supine,  working on physiological scientist assistive range of motion.  Therapist applying light manual pressure as a counterforce to side of breast and at pectoralis area to decrease pain and feeling of tightness and stretch.  Exercises performed for shoulder flexion, ABD and moving into scaption pattern.  Use of Wand for these movements also while therapist performing counter stretch and patient tolerated well.  Continue with slow prolonged stretch when performing.    Pt has been wearing chip bag in compression bra however, compression bra appears to large now, she switched to using sports bra and feels better.  Discussed with patient she will likely tolerate jovi pak well since she has done well with chip bag.  Measurements taken this date and printed out to take with her to get her compression sleeve and jovi pak.    Pt reports she discussed MLD with her provider who agreed she may benefit and reports the provider sent a note to proceed.    Manual Therapy:   Pt seen for application of manual lymphatic drainage techniques to UE and associated pathways per protocol.  Instructed on self MLD for home program, will plan to send instructions to patient.   Some increased neuropathy symptoms this date in right arm and hand.    HOME EXERCISE PROGRAM: Reviewed shoulder flexion and horizontal abduction using wand in supine (add scaption) Right upper extremity external rotation combination with a shoulder flexion on  doorframe sliding up. 2-3 times a day 12-15 reps Composite nerve glide as well as ulnar nerve glide 5 reps 3 times a day Chip bag fabricated for right breast to decrease fibrosis and scar tissue patient to wear as much as she can tolerate inside her bra (try with compression bra if she can tolerate) ASSESSMENT:  CLINICAL IMPRESSION: Patient is 4 and half months postop right lumpectomy followed by radiation.  Patient with a hard lump over incision on the right lateral breast that appeared to have shown in the  past seroma but could not drain any fluid.  Last 1 per patient showed blocked duct.  Right breast fibrotic with scar tissue and tenderness and pain limiting shoulder range of motion.  Patient had lymphatic cording prior to radiation.  Patient limited in shoulder and elbow wrist active range of motion in all planes.  With increased pain 6/10 in axilla, upper arm into the volar elbow to wrist.  Patient appears to have stage I lymphedema in right upper extremity and can benefit from a over-the-counter compression sleeve and gauntlet to wear during the day first 6 to 8 hours to assist in mobilization of lymphatic cording.  Chip bag was put fabricated for patient to use on the right lateral breast to decrease fibrosis.  Patient can benefit from a unilateral postmastectomy Jovi pack in the near future.  Pt progressed well with exercises,  modifications made with ROM with scaption, responded well to counterforce stretch from therapist while performing exercises.  Reinforced posture, keeping chest open to avoid further pectoralis tightness.  Pt currently using her compression bra or sports bra with chip bag. Pt has her measurements to follow up to get her compression sleeve and gauntlet as well as jovi pak since she appears to be tolerating chip bag well.  Pt responded well to MLD with instruction on self application for home program but will need written instructions and additional instruction to become proficient with use.   Patient limited in functional use of right upper extremity in ADLs and IADLs.  Patient with a history of neuropathy, carpal tunnel and right upper extremity.  Has a history of complex regional in right foot.  Pt will continue to benefit from skilled therapeutic intervention to improve on the following deficits: Decreased knowledge of precautions, impaired UE functional use, pain, decreased ROM, postural dysfunction.   OT treatment/interventions: ADL/Self care home management, 97110-Therapeutic  exercises, 97530- Therapeutic activity, 97535- Self Care, [id number]- Manual therapy, Patient/Family education, Manual lymph drainage, Scar mobilization, and DME instructions   GOALS: Goals reviewed with patient? Yes  LONG TERM GOALS:  (STG=LTG)  GOALS Name Target Date  Goal status  1 Pt will demonstrate she has regained R  shoulder ROM and function post operatively to be independ in ADL's and IADL's - close to prior level of function  Baseline: AROM WFL - pt did had CTS and neuropathy  prior to tx  12 wks INITIAL  2 Fibrosis and scar tissue with tenderness on right breast decrease to less than 2/10.  BASELINE: Patient has a hard lump over incision since after surgery that got worse after radiation.  Tenderness and pain 4-6/10 limiting her range of motion at R shoulder and discomfort and tenderness over lateral breast into  pect and axilla  12 INITIAL  3 Patient to be independent and wearing correct compression sleeve and glove as well as compression for right breast to decrease lymphatic symptoms and prevent future infection and lymphatic cording. BASELINE: Patient had lymphatic  cording prior to radiation on right axilla down to wrist, lump with fibrosis possible seroma- possible blocked duct at incision on right lateral breast with increased pain 4-6/10- no compression this far or garments used 12 INITIAL          PLAN:  OT FREQUENCY/DURATION: 1-2 x wk depending on progress- 12 wks   PLAN FOR NEXT SESSION: Review home program, progress with range of motion as well as progress with use of tobacco on fibrotic and painful scar tissue on lateral breast, and assess for fitting for compression sleeve and glove on right upper extremity    Scar massage You can begin gentle scar massage to you incision sites. Gently place one hand on the incision and move the skin (without sliding on the skin) in various directions. Do this for a few minutes and then you can gently massage either coconut oil or  vitamin E cream into the scars.  Compression garment You should continue wearing your compression bra until you feel like you no longer have swelling.  Home exercise Program Continue doing the exercises you were given until you feel like you can do them without feeling any tightness at the end.   Walking Program Studies show that 30 minutes of walking per day (fast enough to elevate your heart rate) can significantly reduce the risk of a cancer recurrence. If you can't walk due to other medical reasons, we encourage you to find another activity you could do (like a stationary bike or water exercise).  Posture After breast cancer surgery, people frequently sit with rounded shoulders posture because it puts their incisions on slack and feels better. If you sit like this and scar tissue forms in that position, you can become very tight and have pain sitting or standing with good posture. Try to be aware of your posture and sit and stand up tall to heal properly.    Jeffren Dombek, OTR/L,CLT 08/29/2023, 12:57 PM

## 2023-09-11 ENCOUNTER — Ambulatory Visit: Payer: Medicaid Other | Admitting: Occupational Therapy

## 2023-09-15 ENCOUNTER — Ambulatory Visit: Payer: Medicaid Other | Admitting: Occupational Therapy

## 2023-09-25 ENCOUNTER — Ambulatory Visit: Payer: Medicaid Other | Attending: Internal Medicine | Admitting: Occupational Therapy

## 2023-09-25 DIAGNOSIS — M25611 Stiffness of right shoulder, not elsewhere classified: Secondary | ICD-10-CM | POA: Insufficient documentation

## 2023-09-25 DIAGNOSIS — I890 Lymphedema, not elsewhere classified: Secondary | ICD-10-CM | POA: Insufficient documentation

## 2023-09-25 DIAGNOSIS — L905 Scar conditions and fibrosis of skin: Secondary | ICD-10-CM | POA: Diagnosis present

## 2023-09-25 DIAGNOSIS — M79601 Pain in right arm: Secondary | ICD-10-CM | POA: Diagnosis present

## 2023-09-25 NOTE — Therapy (Signed)
 OUTPATIENT OCCUPATIONAL THERAPY BREAST CANCER TREATMENT NOTE   Patient Name: Stacey Morris MRN: [id number] DOB:[birth year], 53 y.o., female   END OF SESSION:  OT End of Session - 09/25/23 1602     Visit Number 4    Number of Visits 12    Date for OT Re-Evaluation 10/02/23    OT Start Time 1453    OT Stop Time 1550    OT Time Calculation (min) 57 min    Activity Tolerance Patient tolerated treatment well              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   Allergy    Asthma    BRCA negative 04/2020   MyRisk neg except MSH6 VUS   Carpal tunnel syndrome    Complex regional pain syndrome I    Family history of breast cancer    Family history of kidney cancer    Family history of ovarian cancer    Genital herpes    Genital warts    Hematuria    Increased risk of breast cancer 04/2020   IBIS=15.2%/riskscore=21.4%   Peripheral neuropathy 2020   Screening for colon cancer 07/2021   NEG cologuard, repeat after 3 yrs   Vitamin D deficiency    Past Surgical History:  Procedure Laterality Date   BREAST BIOPSY Right 01/21/2023   U?S BX,heart clip path pending   BREAST BIOPSY Right 01/21/2023   u/s bx, axilla COIL clip-path penmding   BREAST BIOPSY Right 01/21/2023   US  RT BREAST BX W LOC DEV 1ST LESION IMG BX SPEC US  GUIDE 01/21/2023 [facility]   CERVICAL BIOPSY  W/ LOOP ELECTRODE EXCISION  2010   LGSIL benign pathology   Patient Active Problem List   Diagnosis Date Noted   Intractable neuropathic pain of foot, left 01/09/2023   DDD (degenerative disc disease), cervical 08/15/2021   Tenosynovitis of tibialis posterior tendon (Left) 08/15/2021   History of cervical dysplasia 06/05/2021   Family history of breast cancer 06/05/2021   Increased risk of breast cancer 06/05/2021   Metatarsalgia of foot (Left) 05/18/2021   Tenosynovitis of ankle (Left) 05/18/2021   Generalized Sensory  Polyneuropathy by LE EMG/PNCV (06/30/2019) 05/18/2021   Chronic pain syndrome 05/16/2021   Pharmacologic therapy 05/16/2021   Disorder of skeletal system 05/16/2021   Problems influencing health status 05/16/2021   Chronic foot pain (1ry area of Pain) (Left) 05/16/2021   Chronic lower extremity pain (2ry area of Pain) (Left) 05/16/2021   Chronic knee pain (3ry area of Pain) (Bilateral) (L>R) 05/16/2021   Chronic neck pain (5th area of Pain) (Midline) 05/16/2021   Cervicogenic headache 05/16/2021   Occipital headache (Bilateral) 05/16/2021   Greater occipital neuralgia (Bilateral) 05/16/2021   Chronic shoulder pain (6th area of Pain) (Bilateral) 05/16/2021   Carpal tunnel syndrome (Right) 05/16/2021   H/O degenerative disc disease 05/15/2021   Complex regional pain syndrome type 1 of left lower extremity 03/11/2021   Genital herpes 05/05/2020   Hematuria, microscopic 05/05/2020   Asthma, stable, mild intermittent 05/05/2020   Family history of ovarian cancer 04/26/2020   Family history of kidney cancer 09/20/2019   Asthma, stable, moderate persistent 06/21/2019   Vitamin D insufficiency 05/28/2019   Insomnia 05/25/2019   Low serum vitamin D 05/25/2019   Major depressive disorder, recurrent, in partial remission (HCC) 05/25/2019   Intramural and subserous leiomyoma of uterus 02/25/2019   DDD (degenerative disc  disease), lumbosacral 02/26/2018   Lumbar radiculopathy 02/26/2018   Seasonal allergic rhinitis 02/26/2018   RAD (reactive airway disease), moderate persistent, uncomplicated 02/26/2018   Asthma    Allergies    Chronic low back pain (4th area of Pain) (Bilateral) (R>L) w/o sciatica 01/30/2015   Abnormal Pap smear of cervix 08/19/2008    PCP: Dr Sadie  REFERRING PROVIDER: Dr Sadie  REFERRING DIAG: R breast CA with radiation/lumpectomy   THERAPY DIAG:  Scar condition and fibrosis of skin  Pain in right arm  Stiffness of right shoulder, not elsewhere  classified  Lymphedema, not elsewhere classified  Rationale for Evaluation and Treatment: Rehabilitation  ONSET DATE: 02/18/23  SUBJECTIVE:                                                                                                                                                                                           SUBJECTIVE STATEMENT: Patient reports she is going to have a scan of her chest.  Because of increase shortness of breath.  As well as a bone scan.  She is waiting for a compression sleeve for right arm.  She showed me how it fitted it was really hard to put on.  My side of my breast still hurts and tender and pull when I try to lift my arm.  PERTINENT HISTORY: ONCOLOGY 06/20/23 ASSESSMENT & DISCUSSION   This is a 53 y.o. 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  Ms. Mosey had recurrent R UOQ seroma which is tender and exacerbated after completing radiation. She has limited R arm ROM Rx given for PT,IB to [facility] to find local provider in [place]. She was given Rx for compression sleeve as recommended by current PT.   PLAN   No additional surgery is recommended at this time Next bilateral contrast enhanced mammogram due 01/2024 Return 01/2024 for a follow-up office visit and breast exam with same day mammogram Recommend PT with [facility] provider to improve R arm ROM and lymphedema of R breast/arm.  May use compression sleeve daily in daytime for R arm lymphedema.  Christobal Car[employer] Lines   PATIENT GOALS:  Reassess how my recovery is going related to arm function, pain, and swelling.  PAIN:  Are you having pain? 5-7/10 right breast into axilla and upper arm  PRECAUTIONS: Recent Surgery, R  UE Lymphedema risk,      ACTIVITY LEVEL / LEISURE: Used to walk in  the mall.  35 year old daughter lives with her.  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   OBJECTIVE:    OBSERVATIONS: Patient with rounded shoulder on the right more than the left.  Guarding her right arm carrying a close to body.  Because of pain.  Some slow deliberate motion for shoulder motion as well as elbow.  POSTURE:  Rounded shoulders.  LYMPHEDEMA ASSESSMENT:       Surgery type/Date: 02/18/23 Number of lymph nodes removed: 2 Current/past treatment (radiation, hormone therapy): yes Other symptoms:  Heaviness/tightness Yes Pain Yes Pitting edema No Infections No Decreased scar mobility Yes lateal breast  Stemmer sign No PATIENT EDUCATION: Education details: Progress, anatomy and HEP  Person educated: Patient Education method: Explanation, Demonstration, Tactile cues, Verbal cues, and Handouts Education comprehension: verbalized understanding, returned demonstration, verbal cues required, and needs further education  TREATMENT: 09/25/23  Patient was not seen for about a month OT did reassessment Patient arrive with continues increased pain and tenderness over right breast into the axilla and upper arm. Increase pain with shoulder abduction to 90 degrees and flexion to 130. As well as increased pain with external rotation Patient report she is going to be fitted for a compression sleeve. Patient was educated in donning and doffing compression sleeve correctly to not hurt her shoulder.  Demo for patient 3 times different techniques verbalized understanding Patient to wear it with high risk activity.  Grocery shopping, cleaning, working out, pulling pushing Patient to bring it in next time for OT to assess fitting. Assist patient with nerve glides.  Median and radial nerve within normal limits. But increased tension with ulnar nerve glide.  Unable to extend  elbow Educated patient in anatomy of pectoralis major and breast and shoulder.  Manual Therapy:   Patient is supine done manual soft tissue with fibrosis technique on right breast.  Followed by mini massager on bottom half as well as lateral breast with great success.  Less fibrosis.  Therapeutic Exercise: After manual done active assisted range of motion using the wand for shoulder flexion as well as scaption..  Therapist applying light manual pressure at endrange as well as facilitating a contract relax.  Exercises performed for shoulder flexion, ABD and moving into scaption pattern.  Use of Wand for these movements  Patient to do manual massage to right breast 2 times a day for like 3 to 4 minutes. Followed by supine wand exercises for shoulder flexion and scaption as well as external rotation   Patient shoulder flexion was 130 and at the end of session 145 degrees with less pain Shoulder abduction was 90 coming in and at the end of session 130 degrees.  With less pain As well as external rotation increased with less pain.  Reviewed ulnar nerve glide again.  Patient was able to do full ulnar nerve glide with extended elbow with no pain.  5 reps for patient to do twice a day      HOME EXERCISE PROGRAM:  2 times a day for 3 to 4 minutes soft tissue massage to the right breast followed by in supine shoulder flexion and scaption with external rotation using a wand Ulnar nerve glide 5 reps 2 times a day ASSESSMENT:  CLINICAL IMPRESSION: Patient is postop right lumpectomy followed by radiation.  Patient with a hard lump over incision on the right lateral breast that appeared to have shown in the past seroma .  They could not drain it.  Right breast fibrotic with scar tissue and tenderness and pain limiting shoulder range of motion.  Patient had lymphatic cording prior to radiation.  Patient limited in shoulder and elbow wrist active range of motion in all planes.  With increased pain 6/10 in  axilla, upper arm into the volar elbow to wrist.  Patient appears to have stage I lymphedema in right upper extremity and can benefit from a over-the-counter compression sleeve and gauntlet to wear during the day with high risk activities.  Patient can stop wearing the to back and had great success with soft tissue massage to right breast to decrease fibrosis.  Patient responded great to today with soft tissue massage with increased shoulder motion overhead with less pain this date.  Patient can benefit from a unilateral postmastectomy Jovi pack in the near future.  Patient limited in functional use of right upper extremity in ADLs and IADLs.  Patient with a history of neuropathy, carpal tunnel and right upper extremity.  Has a history of complex regional in right foot.  Pt will continue to benefit from skilled therapeutic intervention to improve on the following deficits: Decreased knowledge of precautions, impaired UE functional use, pain, decreased ROM, postural dysfunction.   OT treatment/interventions: ADL/Self care home management, 97110-Therapeutic exercises, 97530- Therapeutic activity, 97535- Self Care, [id number]- Manual therapy, Patient/Family education, Manual lymph drainage, Scar mobilization, and DME instructions   GOALS: Goals reviewed with patient? Yes  LONG TERM GOALS:  (STG=LTG)  GOALS Name Target Date  Goal status  1 Pt will demonstrate she has regained R  shoulder ROM and function post operatively to be independ in ADL's and IADL's - close to prior level of function  Baseline: AROM WFL - pt did had CTS and neuropathy  prior to tx  12 wks INITIAL  2 Fibrosis and scar tissue with tenderness on right breast decrease to less than 2/10.  BASELINE: Patient has a hard lump over incision since after surgery that got worse after radiation.  Tenderness and pain 4-6/10 limiting her range of motion at R shoulder and discomfort and tenderness over lateral breast into  pect and axilla  12 INITIAL   3 Patient to be independent and wearing correct compression sleeve and glove as well as compression for right breast to decrease lymphatic symptoms and prevent future infection and lymphatic cording. BASELINE: Patient had lymphatic cording prior to radiation on right axilla down to wrist, lump with fibrosis possible seroma- possible blocked duct at incision on right lateral breast with increased pain 4-6/10- no compression this far or garments used 12 INITIAL          PLAN:  OT FREQUENCY/DURATION: 1-2 x wk depending on progress- 12 wks   PLAN FOR NEXT SESSION: Review home program, progress with range of motion as well as progress with use of tobacco on fibrotic and painful scar tissue on lateral breast, and assess for fitting for compression sleeve and glove on right upper extremity    Scar massage You can begin gentle scar massage to you incision sites. Gently place one hand on the incision and move the skin (without sliding on the skin) in various directions. Do this for a few minutes and then you can gently massage either coconut oil or vitamin E cream into the scars.  Compression garment You should continue wearing your compression bra until you feel like you no longer have swelling.  Home exercise Program Continue doing the exercises you were given until you feel like you can do them  without feeling any tightness at the end.   Walking Program Studies show that 30 minutes of walking per day (fast enough to elevate your heart rate) can significantly reduce the risk of a cancer recurrence. If you can't walk due to other medical reasons, we encourage you to find another activity you could do (like a stationary bike or water exercise).  Posture After breast cancer surgery, people frequently sit with rounded shoulders posture because it puts their incisions on slack and feels better. If you sit like this and scar tissue forms in that position, you can become very tight and have pain  sitting or standing with good posture. Try to be aware of your posture and sit and stand up tall to heal properly.    Ancel Peters, OTR/L,CLT 09/25/2023, 4:06 PM

## 2023-09-30 ENCOUNTER — Ambulatory Visit: Payer: Medicaid Other | Admitting: Occupational Therapy

## 2023-10-02 ENCOUNTER — Ambulatory Visit: Payer: Medicaid Other | Admitting: Occupational Therapy

## 2023-10-02 DIAGNOSIS — L905 Scar conditions and fibrosis of skin: Secondary | ICD-10-CM

## 2023-10-02 DIAGNOSIS — M25611 Stiffness of right shoulder, not elsewhere classified: Secondary | ICD-10-CM

## 2023-10-02 DIAGNOSIS — M79601 Pain in right arm: Secondary | ICD-10-CM

## 2023-10-02 DIAGNOSIS — I890 Lymphedema, not elsewhere classified: Secondary | ICD-10-CM

## 2023-10-02 NOTE — Therapy (Signed)
OUTPATIENT OCCUPATIONAL THERAPY BREAST CANCER TREATMENT NOTE   Patient Name: Stacey Morris MRN: [id number] DOB:[birth year], 53 y.o., female   END OF SESSION:  OT End of Session - 10/02/23 1559     Visit Number 5    Number of Visits 12    Date for OT Re-Evaluation 10/02/23    OT Start Time 1449    OT Stop Time 1544    OT Time Calculation (min) 55 min    Activity Tolerance Patient tolerated treatment well    Behavior During Therapy [facility] for tasks assessed/performed              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   Allergy    Asthma    BRCA negative 04/2020   MyRisk neg except MSH6 VUS   Carpal tunnel syndrome    Complex regional pain syndrome I    Family history of breast cancer    Family history of kidney cancer    Family history of ovarian cancer    Genital herpes    Genital warts    Hematuria    Increased risk of breast cancer 04/2020   IBIS=15.2%/riskscore=21.4%   Peripheral neuropathy 2020   Screening for colon cancer 07/2021   NEG cologuard, repeat after 3 yrs   Vitamin D deficiency    Past Surgical History:  Procedure Laterality Date   BREAST BIOPSY Right 01/21/2023   U?S BX,heart clip path pending   BREAST BIOPSY Right 01/21/2023   u/s bx, axilla COIL clip-path penmding   BREAST BIOPSY Right 01/21/2023   [place] RT BREAST BX W LOC DEV 1ST LESION IMG BX SPEC US GUIDE 01/21/2023 [facility]   CERVICAL BIOPSY  W/ LOOP ELECTRODE EXCISION  2010   LGSIL benign pathology   Patient Active Problem List   Diagnosis Date Noted   Intractable neuropathic pain of foot, left 01/09/2023   DDD (degenerative disc disease), cervical 08/15/2021   Tenosynovitis of tibialis posterior tendon (Left) 08/15/2021   History of cervical dysplasia 06/05/2021   Family history of breast cancer 06/05/2021   Increased risk of breast cancer 06/05/2021   Metatarsalgia of foot (Left) 05/18/2021   Tenosynovitis  of ankle (Left) 05/18/2021   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) 05/18/2021   Chronic pain syndrome 05/16/2021   Pharmacologic therapy 05/16/2021   Disorder of skeletal system 05/16/2021   Problems influencing health status 05/16/2021   Chronic foot pain (1ry area of Pain) (Left) 05/16/2021   Chronic lower extremity pain (2ry area of Pain) (Left) 05/16/2021   Chronic knee pain (3ry area of Pain) (Bilateral) (L>R) 05/16/2021   Chronic neck pain (5th area of Pain) (Midline) 05/16/2021   Cervicogenic headache 05/16/2021   Occipital headache (Bilateral) 05/16/2021   Greater occipital neuralgia (Bilateral) 05/16/2021   Chronic shoulder pain (6th area of Pain) (Bilateral) 05/16/2021   Carpal tunnel syndrome (Right) 05/16/2021   H/O degenerative disc disease 05/15/2021   Complex regional pain syndrome type 1 of left lower extremity 03/11/2021   Genital herpes 05/05/2020   Hematuria, microscopic 05/05/2020   Asthma, stable, mild intermittent 05/05/2020   Family history of ovarian cancer 04/26/2020   Family history of kidney cancer 09/20/2019   Asthma, stable, moderate persistent 06/21/2019   Vitamin D insufficiency 05/28/2019   Insomnia 05/25/2019   Low serum vitamin D 05/25/2019   Major depressive disorder, recurrent, in partial remission (HCC) 05/25/2019   Intramural and  subserous leiomyoma of uterus 02/25/2019   DDD (degenerative disc disease), lumbosacral 02/26/2018   Lumbar radiculopathy 02/26/2018   Seasonal allergic rhinitis 02/26/2018   RAD (reactive airway disease), moderate persistent, uncomplicated 02/26/2018   Asthma    Allergies    Chronic low back pain (4th area of Pain) (Bilateral) (R>L) w/o sciatica 01/30/2015   Abnormal Pap smear of cervix 08/19/2008    PCP: Dr Stacey Morris  REFERRING PROVIDER: Dr Stacey Morris  REFERRING DIAG: R breast CA with radiation/lumpectomy   THERAPY DIAG:  Scar condition and fibrosis of skin  Pain in right arm  Stiffness  of right shoulder, not elsewhere classified  Lymphedema, not elsewhere classified  Rationale for Evaluation and Treatment: Rehabilitation  ONSET DATE: 02/18/23  SUBJECTIVE:                                                                                                                                                                                           SUBJECTIVE STATEMENT: My back hurts today.  I am having the end of the month but bone scan.  Feeling my motion is better and I am not as tight.  Exercises feel little better.  My side of my breast and armpit still tight when II try to lift my arm.  The forward nerve glide is much better that pain my wrist is better  PERTINENT HISTORY: ONCOLOGY 06/20/23 ASSESSMENT & DISCUSSION   This is a 53 y.o. 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  Stacey Morris had recurrent R UOQ seroma which is tender and exacerbated after completing radiation. She has limited R arm ROM Rx given for PT,IB to [facility] to find local provider in [place]. She was given Rx for compression sleeve as recommended by current PT.   PLAN   No additional surgery is recommended at this time Next bilateral contrast enhanced mammogram due 01/2024 Return 01/2024 for a follow-up office visit and breast exam with same day mammogram Recommend PT with [facility] provider to improve R arm ROM and lymphedema of R breast/arm.  May use compression sleeve daily in daytime for R arm lymphedema.  Stacey Morris   PATIENT GOALS:  Reassess how my recovery is going related to arm function, pain, and swelling.  PAIN:  Are you having pain? 4/10 right breast into axilla and upper arm  PRECAUTIONS: Recent Surgery, R  UE Lymphedema risk,      ACTIVITY LEVEL / LEISURE:  Used to walk in the mall.  32 year old daughter lives with her.  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   OBJECTIVE:    OBSERVATIONS: Patient with rounded shoulder on the right more than the left.  Guarding her right arm carrying a close to body.  Because of pain.  Some slow deliberate motion for shoulder motion as well as elbow.  POSTURE:  Rounded shoulders.  LYMPHEDEMA ASSESSMENT:       Surgery type/Date: 02/18/23 Number of lymph nodes removed: 2 Current/past treatment (radiation, hormone therapy): yes Other symptoms:  Heaviness/tightness Yes Pain Yes Pitting edema No Infections No Decreased scar mobility Yes lateal breast  Stemmer sign No PATIENT EDUCATION: Education details: Progress, anatomy and HEP  Person educated: Patient Education method: Explanation, Demonstration, Tactile cues, Verbal cues, and Handouts Education comprehension: verbalized understanding, returned demonstration, verbal cues required, and needs further education  TREATMENT: 10/02/23  Patient arrived with increased right shoulder flexion.  130 degrees was able to maintain progress since last week.  Abduction improved to 100 degrees.  External rotation about the same. Both with still continues tightness and pull over pectoralis major and lateral chest and axilla Patient arrive with continues increased pain and tightness lateral right breast into the axilla and upper arm.  Mostly with  R shoulder abduction and external rotation IPatient report she is going to be fitted for a compression sleeve. Patient was educated in donning and doffing compression sleeve correctly to not hurt her shoulder last time   Demo for patient 3 times different techniques verbalized understanding Patient to wear it with high risk activity.  Grocery shopping, cleaning, working out, pulling pushing Patient to bring it in next time for OT to  assess fitting.   Patient's ulnar nerve glide improved to within normal limits with less pain over volar wrist.   Changed to performed out to the right side.  Can do partial nerve glide with increased tightness and pull.   Improved at the end of the session with less pull and tightness.  Patient to do ulnar nerve glide out to the side.  5 reps 3 times a day   Educated patient  last time  anatomy of pectoralis major and breast and shoulder.  Manual Therapy:   Patient is supine done manual soft tissue with fibrosis technique on lateral  right breast.  Followed by mini massager on lateral breast  into pectoralis major and upper arm with great success.  Less fibrosis.  Therapeutic Exercise: After manual done shoulder flexion contract relax in supine 10 reps less pain  Continue to have some discomfort with shoulder abduction external rotation in supine  Did show increased motion.   Reviewed with patient child's pose in sitting performing on table forward as well as lateral left and right 10 reps.   The patient can do at home twice a day  Change home program from supine to sitting using pulleys for active assisted range of motion shoulder flexion abduction 20 reps each  After which sitting patient performed active range of motion shoulder flexion as well as abduction and external rotation 10 reps each  With less tightness and pain.   Change home program to pulleys as well as external rotation stretch in the corner prior to active assisted range of motion and sitting up to 3 times a day.    Patient to do manual massage to right breast 2 times a day for like 3 to 4 minutes.  Patient shoulder flexion 150 degrees at the end of session and abduction 120 degrees.  With external rotation 80 degrees       ASSESSMENT:  CLINICAL IMPRESSION: Patient is postop right lumpectomy followed by radiation.  Patient with a hard lump over incision on the right lateral breast that appeared to have shown in  the past seroma .  They could not drain it.  Right breast fibrotic with scar tissue and tenderness and pain limiting shoulder range of motion.  Patient had lymphatic cording prior to radiation.  Patient limited in shoulder and elbow wrist active range of motion in all planes.  With increased pain 6/10 in axilla, upper arm into the volar elbow to wrist.  Patient appears to have stage I lymphedema in right upper extremity and can benefit from a over-the-counter compression sleeve and gauntlet to wear during the day with high risk activities.  Patient can stop wearing the to back and had great success with soft tissue massage to right breast to decrease fibrosis.  Patient responded again great  today with soft tissue massage with increased shoulder motion overhead with less pain .  Did change patient's active assisted range of motion in supine to performing pulleys as well as corner stretch for external rotation with great success.  Progress active range of motion overhead and sitting less pain and tightness.  Patient can benefit from a unilateral postmastectomy Jovi pack in the near future.  Patient limited in functional use of right upper extremity in ADLs and IADLs.  Patient with a history of neuropathy, carpal tunnel and right upper extremity.  Has a history of complex regional in right foot.  Pt will continue to benefit from skilled therapeutic intervention to improve on the following deficits: Decreased knowledge of precautions, impaired UE functional use, pain, decreased ROM, postural dysfunction.   OT treatment/interventions: ADL/Self care home management, 97110-Therapeutic exercises, 97530- Therapeutic activity, 97535- Self Care, 13086- Manual therapy, Patient/Family education, Manual lymph drainage, Scar mobilization, and DME instructions   GOALS: Goals reviewed with patient? Yes  LONG TERM GOALS:  (STG=LTG)  GOALS Name Target Date  Goal status  1 Pt will demonstrate she has regained R  shoulder  ROM and function post operatively to be independ in ADL's and IADL's - close to prior level of function  Baseline: AROM WFL - pt did had CTS and neuropathy  prior to tx  12 wks INITIAL  2 Fibrosis and scar tissue with tenderness on right breast decrease to less than 2/10.  BASELINE: Patient has a hard lump over incision since after surgery that got worse after radiation.  Tenderness and pain 4-6/10 limiting her range of motion at R shoulder and discomfort and tenderness over lateral breast into  pect and axilla  12 INITIAL  3 Patient to be independent and wearing correct compression sleeve and glove as well as compression for right breast to decrease lymphatic symptoms and prevent future infection and lymphatic cording. BASELINE: Patient had lymphatic cording prior to radiation on right axilla down to wrist, lump with fibrosis possible seroma- possible blocked duct at incision on right lateral breast with increased pain 4-6/10- no compression this far or garments used 12 INITIAL          PLAN:  OT FREQUENCY/DURATION: 1-2 x wk depending on progress- 12 wks   PLAN FOR NEXT SESSION: Review home program, progress with range of motion as well as progress with use of tobacco on fibrotic and painful scar tissue on lateral breast, and  assess for fitting for compression sleeve and glove on right upper extremity    Scar massage You can begin gentle scar massage to you incision sites. Gently place one hand on the incision and move the skin (without sliding on the skin) in various directions. Do this for a few minutes and then you can gently massage either coconut oil or vitamin E cream into the scars.  Compression garment You should continue wearing your compression bra until you feel like you no longer have swelling.  Home exercise Program Continue doing the exercises you were given until you feel like you can do them without feeling any tightness at the end.   Walking Program Studies show that 30  minutes of walking per day (fast enough to elevate your heart rate) can significantly reduce the risk of a cancer recurrence. If you can't walk due to other medical reasons, we encourage you to find another activity you could do (like a stationary bike or water exercise).  Posture After breast cancer surgery, people frequently sit with rounded shoulders posture because it puts their incisions on slack and feels better. If you sit like this and scar tissue forms in that position, you can become very tight and have pain sitting or standing with good posture. Try to be aware of your posture and sit and stand up tall to heal properly.    Oletta Cohn, OTR/L,CLT 10/02/2023, 4:01 PM

## 2023-10-07 ENCOUNTER — Ambulatory Visit: Payer: Medicaid Other | Admitting: Occupational Therapy

## 2023-10-09 ENCOUNTER — Ambulatory Visit: Payer: Medicaid Other | Admitting: Occupational Therapy

## 2023-10-20 ENCOUNTER — Ambulatory Visit: Payer: Medicaid Other | Attending: Internal Medicine | Admitting: Occupational Therapy

## 2023-10-20 DIAGNOSIS — L905 Scar conditions and fibrosis of skin: Secondary | ICD-10-CM | POA: Insufficient documentation

## 2023-10-20 DIAGNOSIS — M25611 Stiffness of right shoulder, not elsewhere classified: Secondary | ICD-10-CM | POA: Diagnosis present

## 2023-10-20 DIAGNOSIS — I890 Lymphedema, not elsewhere classified: Secondary | ICD-10-CM | POA: Insufficient documentation

## 2023-10-20 DIAGNOSIS — M79601 Pain in right arm: Secondary | ICD-10-CM | POA: Insufficient documentation

## 2023-10-20 NOTE — Therapy (Signed)
 OUTPATIENT OCCUPATIONAL THERAPY BREAST CANCER TREATMENT NOTE   Patient Name: Stacey Morris MRN: [id number] DOB:[birth year], 53 y.o., female   END OF SESSION:  OT End of Session - 10/20/23 1956     Visit Number 6    Number of Visits 12    Date for OT Re-Evaluation 12/15/23    OT Start Time 1532    OT Stop Time 1610    OT Time Calculation (min) 38 min    Activity Tolerance Patient tolerated treatment well    Behavior During Therapy [facility] for tasks assessed/performed              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   Allergy    Asthma    BRCA negative 04/2020   MyRisk neg except MSH6 VUS   Carpal tunnel syndrome    Complex regional pain syndrome I    Family history of breast cancer    Family history of kidney cancer    Family history of ovarian cancer    Genital herpes    Genital warts    Hematuria    Increased risk of breast cancer 04/2020   IBIS=15.2%/riskscore=21.4%   Peripheral neuropathy 2020   Screening for colon cancer 07/2021   NEG cologuard, repeat after 3 yrs   Vitamin D deficiency    Past Surgical History:  Procedure Laterality Date   BREAST BIOPSY Right 01/21/2023   U?S BX,heart clip path pending   BREAST BIOPSY Right 01/21/2023   u/s bx, axilla COIL clip-path penmding   BREAST BIOPSY Right 01/21/2023   [place] RT BREAST BX W LOC DEV 1ST LESION IMG BX SPEC US GUIDE 01/21/2023 [facility]   CERVICAL BIOPSY  W/ LOOP ELECTRODE EXCISION  2010   LGSIL benign pathology   Patient Active Problem List   Diagnosis Date Noted   Intractable neuropathic pain of foot, left 01/09/2023   DDD (degenerative disc disease), cervical 08/15/2021   Tenosynovitis of tibialis posterior tendon (Left) 08/15/2021   History of cervical dysplasia 06/05/2021   Family history of breast cancer 06/05/2021   Increased risk of breast cancer 06/05/2021   Metatarsalgia of foot (Left) 05/18/2021   Tenosynovitis  of ankle (Left) 05/18/2021   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) 05/18/2021   Chronic pain syndrome 05/16/2021   Pharmacologic therapy 05/16/2021   Disorder of skeletal system 05/16/2021   Problems influencing health status 05/16/2021   Chronic foot pain (1ry area of Pain) (Left) 05/16/2021   Chronic lower extremity pain (2ry area of Pain) (Left) 05/16/2021   Chronic knee pain (3ry area of Pain) (Bilateral) (L>R) 05/16/2021   Chronic neck pain (5th area of Pain) (Midline) 05/16/2021   Cervicogenic headache 05/16/2021   Occipital headache (Bilateral) 05/16/2021   Greater occipital neuralgia (Bilateral) 05/16/2021   Chronic shoulder pain (6th area of Pain) (Bilateral) 05/16/2021   Carpal tunnel syndrome (Right) 05/16/2021   H/O degenerative disc disease 05/15/2021   Complex regional pain syndrome type 1 of left lower extremity 03/11/2021   Genital herpes 05/05/2020   Hematuria, microscopic 05/05/2020   Asthma, stable, mild intermittent 05/05/2020   Family history of ovarian cancer 04/26/2020   Family history of kidney cancer 09/20/2019   Asthma, stable, moderate persistent 06/21/2019   Vitamin D insufficiency 05/28/2019   Insomnia 05/25/2019   Low serum vitamin D 05/25/2019   Major depressive disorder, recurrent, in partial remission (HCC) 05/25/2019   Intramural and  subserous leiomyoma of uterus 02/25/2019   DDD (degenerative disc disease), lumbosacral 02/26/2018   Lumbar radiculopathy 02/26/2018   Seasonal allergic rhinitis 02/26/2018   RAD (reactive airway disease), moderate persistent, uncomplicated 02/26/2018   Asthma    Allergies    Chronic low back pain (4th area of Pain) (Bilateral) (R>L) w/o sciatica 01/30/2015   Abnormal Pap smear of cervix 08/19/2008    PCP: Dr Stacey Morris  REFERRING PROVIDER: Dr Stacey Morris  REFERRING DIAG: R breast CA with radiation/lumpectomy   THERAPY DIAG:  Scar condition and fibrosis of skin  Pain in right arm  Stiffness  of right shoulder, not elsewhere classified  Lymphedema, not elsewhere classified  Rationale for Evaluation and Treatment: Rehabilitation  ONSET DATE: 02/18/23  SUBJECTIVE:                                                                                                                                                                                           SUBJECTIVE STATEMENT: I seen Dr. Sherryll Morris from neurology today.  An MRI bone scan is tomorrow.  Feeling my motion is better but continued be tight when reaching out to the side..  Exercises feel little better.  My side of my breast and armpit still tight when II try to lift my arm to the side the forward nerve glide is much better that pain my wrist is better-I did not get my sleep yet.  Doing it this week.  They canceled it last week with weather.  PERTINENT HISTORY: ONCOLOGY 06/20/23 ASSESSMENT & DISCUSSION   This is a 53 y.o. 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  Stacey Morris had recurrent R UOQ seroma which is tender and exacerbated after completing radiation. She has limited R arm ROM Rx given for PT,IB to [facility] to find local provider in [place]. She was given Rx for compression sleeve as recommended by current PT.   PLAN   No additional surgery is recommended at this time Next bilateral contrast enhanced mammogram due 01/2024 Return 01/2024 for a follow-up office visit and breast exam with same day mammogram Recommend PT with [facility] provider to improve R arm ROM and lymphedema of R breast/arm.  May use compression sleeve daily in daytime for R arm lymphedema.  Stacey Morris   PATIENT GOALS:  Reassess how my recovery is going related to arm function, pain, and swelling.  PAIN:   Are you having pain? 4/10 right  breast into axilla and upper arm  PRECAUTIONS: Recent Surgery, R  UE Lymphedema risk,     ACTIVITY LEVEL / LEISURE: Used to walk in the mall.  45 year old daughter lives with her.  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   OBJECTIVE:    OBSERVATIONS: Patient with rounded shoulder on the right more than the left.  Guarding her right arm carrying a close to body.  Because of pain.  Some slow deliberate motion for shoulder motion as well as elbow.  POSTURE:  Rounded shoulders.  LYMPHEDEMA ASSESSMENT:       Surgery type/Date: 02/18/23 Number of lymph nodes removed: 2 Current/past treatment (radiation, hormone therapy): yes Other symptoms:  Heaviness/tightness Yes Pain Yes Pitting edema No Infections No Decreased scar mobility Yes lateal breast  Stemmer sign No PATIENT EDUCATION: Education details: Progress, anatomy and HEP  Person educated: Patient Education method: Explanation, Demonstration, Tactile cues, Verbal cues, and Handouts Education comprehension: verbalized understanding, returned demonstration, verbal cues required, and needs further education  TREATMENT: 10/20/23  Patient arrived with increased right shoulder flexion 145 degrees was able to maintain progress since last week.  Abduction about 100 .  External rotation about the same. Both with still continues tightness and pull over pectoralis major and lateral chest and axilla Patient arrive with continues increased  tightness  more than pain in lateral right breast into the axilla and upper arm with ABD more than flexion Pt not seen for about 2-3 wks    Mostly with  R shoulder abduction and external rotation   IPatient report she is going to be fitted for a compression sleeve this week-was canceled because of the weather. Patient was educated in donning and doffing compression sleeve correctly to not hurt her shoulder last  time   Demo for patient 3 times different techniques verbalized understanding in the past  Patient to wear it with high risk activity.  Grocery shopping, cleaning, working out, pulling pushing Patient to bring it in next time for OT to assess fitting.   Patient's ulnar nerve glide improved to within normal limits with less pain over volar wrist.   Changed to performed out to the right side.  Can do partial nerve glide with increased tightness and pull.   Improved at the end of the session with less pull and tightness.  Patient to do ulnar nerve glide out to the side.  5 reps 3 times a day   Educated patient  last time  anatomy of pectoralis major and breast and shoulder.  Manual Therapy:   Patient is supine done manual soft tissue with fibrosis technique on lateral  right breast.  Followed by mini massager on lateral breast  into pectoralis major  as well as axilla .  Therapeutic Exercise: After manual done shoulder flexion and ABD  contract relax in supine 10 reps less pain  Continue to have some discomfort with shoulder abduction external rotation in supine  Did show increased motion.   Reviewed with patient child's pose in sitting performing on table forward as well as lateral left and right 10 reps.   The patient can do at home twice a day  Review pully's and improtance of doing it - especially ABD for active assisted range of motion shoulder flexion abduction 20 reps each 3 x day to maintain progress and decrease stiffness Followed by [facility] on doorframe for ABD 12 reps Less pain and tightness   Change home program to  pulleys as well as external rotation stretch in the corner prior to active assisted range of motion and sitting up to 3 times a day.    Patient to do manual massage to right breast  and axilla 2 times a day for like 3 to 4 minutes. Add RTB for scapula retraction and lat pull downs 12 reps  Can do 1 set 2 x day after her pulley's and ROM     Patient shoulder flexion  150 degrees at the end of session and abduction 115 degrees.  With external rotation 80 degrees       ASSESSMENT:  CLINICAL IMPRESSION: Patient is postop right lumpectomy followed by radiation.  Patient with a hard lump over incision on the right lateral breast that appeared to have shown in the past seroma .  They could not drain it.  Right breast fibrotic with scar tissue and tenderness and pain limiting shoulder range of motion.  Patient had lymphatic cording prior to radiation.  Patient limited in shoulder and elbow wrist active range of motion in all planes.  With increased pain 6/10 in axilla, upper arm into the volar elbow to wrist.  Patient appears to have stage I lymphedema in right upper extremity and can benefit from a over-the-counter compression sleeve and gauntlet to wear during the day with high risk activities.  Patient can stop wearing the to back and had great success with soft tissue massage to right breast to decrease fibrosis.  Patient responded again great  today with soft tissue massage with increased shoulder motion overhead with less pain .Pt to cont with using pulleys for shoulder flexion and ABD as well as corner stretch for external rotation with great success.  Progress active range of motion overhead and sitting less pain and tightness.  Add RTB for scapula retraction and lat pull downs- Patient can benefit from a unilateral postmastectomy Jovi pack in the near future.  Patient limited in functional use of right upper extremity in ADLs and IADLs.  Patient with a history of neuropathy, carpal tunnel and right upper extremity.  Has a history of complex regional in right foot.  Pt will continue to benefit from skilled therapeutic intervention to improve on the following deficits: Decreased knowledge of precautions, impaired UE functional use, pain, decreased ROM, postural dysfunction.   OT treatment/interventions: ADL/Self care home management, 97110-Therapeutic exercises,  97530- Therapeutic activity, 97535- Self Care, 16109- Manual therapy, Patient/Family education, Manual lymph drainage, Scar mobilization, and DME instructions   GOALS: Goals reviewed with patient? Yes  LONG TERM GOALS:  (STG=LTG)  GOALS Name Target Date  Goal status  1 Pt will demonstrate she has regained R  shoulder ROM and function post operatively to be independ in ADL's and IADL's - close to prior level of function  Baseline: AROM WFL - pt did had CTS and neuropathy  prior to tx  12 wks INITIAL  2 Fibrosis and scar tissue with tenderness on right breast decrease to less than 2/10.  BASELINE: Patient has a hard lump over incision since after surgery that got worse after radiation.  Tenderness and pain 4-6/10 limiting her range of motion at R shoulder and discomfort and tenderness over lateral breast into  pect and axilla  12 INITIAL  3 Patient to be independent and wearing correct compression sleeve and glove as well as compression for right breast to decrease lymphatic symptoms and prevent future infection and lymphatic cording. BASELINE: Patient had lymphatic cording prior to radiation on right axilla down  to wrist, lump with fibrosis possible seroma- possible blocked duct at incision on right lateral breast with increased pain 4-6/10- no compression this far or garments used 12 INITIAL          PLAN:  OT FREQUENCY/DURATION: 1-2 x wk depending on progress- 12 wks   PLAN FOR NEXT SESSION: Review home program, progress with range of motion as well as progress with use of tobacco on fibrotic and painful scar tissue on lateral breast, and assess for fitting for compression sleeve and glove on right upper extremity    Scar massage You can begin gentle scar massage to you incision sites. Gently place one hand on the incision and move the skin (without sliding on the skin) in various directions. Do this for a few minutes and then you can gently massage either coconut oil or vitamin E  cream into the scars.  Compression garment You should continue wearing your compression bra until you feel like you no longer have swelling.  Home exercise Program Continue doing the exercises you were given until you feel like you can do them without feeling any tightness at the end.   Walking Program Studies show that 30 minutes of walking per day (fast enough to elevate your heart rate) can significantly reduce the risk of a cancer recurrence. If you can't walk due to other medical reasons, we encourage you to find another activity you could do (like a stationary bike or water exercise).  Posture After breast cancer surgery, people frequently sit with rounded shoulders posture because it puts their incisions on slack and feels better. If you sit like this and scar tissue forms in that position, you can become very tight and have pain sitting or standing with good posture. Try to be aware of your posture and sit and stand up tall to heal properly.    Oletta Cohn, OTR/L,CLT 10/20/2023, 7:57 PM

## 2023-10-27 ENCOUNTER — Encounter: Payer: Self-pay | Admitting: Neurology

## 2023-10-31 ENCOUNTER — Ambulatory Visit
Admission: RE | Admit: 2023-10-31 | Discharge: 2023-10-31 | Disposition: A | Discharge status: Home / Self Care | Source: Ambulatory Visit | Attending: Neurology | Admitting: Neurology

## 2023-10-31 DIAGNOSIS — R4189 Other symptoms and signs involving cognitive functions and awareness: Secondary | ICD-10-CM

## 2023-11-03 ENCOUNTER — Ambulatory Visit: Admitting: Occupational Therapy

## 2023-11-17 ENCOUNTER — Encounter: Admitting: Occupational Therapy

## 2023-11-24 ENCOUNTER — Ambulatory Visit: Admitting: Occupational Therapy

## 2023-12-09 ENCOUNTER — Ambulatory Visit: Attending: Internal Medicine | Admitting: Occupational Therapy

## 2023-12-09 DIAGNOSIS — M25611 Stiffness of right shoulder, not elsewhere classified: Secondary | ICD-10-CM | POA: Diagnosis present

## 2023-12-09 DIAGNOSIS — L905 Scar conditions and fibrosis of skin: Secondary | ICD-10-CM | POA: Insufficient documentation

## 2023-12-09 DIAGNOSIS — M79601 Pain in right arm: Secondary | ICD-10-CM | POA: Diagnosis present

## 2023-12-09 DIAGNOSIS — I890 Lymphedema, not elsewhere classified: Secondary | ICD-10-CM | POA: Insufficient documentation

## 2023-12-09 NOTE — Therapy (Signed)
 OUTPATIENT OCCUPATIONAL THERAPY BREAST CANCER TREATMENT NOTE/RECERT   Patient Name: Stacey Morris MRN: [id number] DOB:[birth year], 53 y.o., female   END OF SESSION:  OT End of Session - 12/09/23 1317     Visit Number 7    Number of Visits 12    Date for OT Re-Evaluation 03/02/24    OT Start Time 1315    OT Stop Time 1355    OT Time Calculation (min) 40 min    Activity Tolerance Patient tolerated treatment well    Behavior During Therapy [facility] for tasks assessed/performed              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   Allergy    Asthma    BRCA negative 04/2020   MyRisk neg except MSH6 VUS   Carpal tunnel syndrome    Complex regional pain syndrome I    Family history of breast cancer    Family history of kidney cancer    Family history of ovarian cancer    Genital herpes    Genital warts    Hematuria    Increased risk of breast cancer 04/2020   IBIS=15.2%/riskscore=21.4%   Peripheral neuropathy 2020   Screening for colon cancer 07/2021   NEG cologuard, repeat after 3 yrs   Vitamin D deficiency    Past Surgical History:  Procedure Laterality Date   BREAST BIOPSY Right 01/21/2023   U?S BX,heart clip path pending   BREAST BIOPSY Right 01/21/2023   u/s bx, axilla COIL clip-path penmding   BREAST BIOPSY Right 01/21/2023   US  RT BREAST BX W LOC DEV 1ST LESION IMG BX SPEC US  GUIDE 01/21/2023 [facility]   CERVICAL BIOPSY  W/ LOOP ELECTRODE EXCISION  2010   LGSIL benign pathology   Patient Active Problem List   Diagnosis Date Noted   Intractable neuropathic pain of foot, left 01/09/2023   DDD (degenerative disc disease), cervical 08/15/2021   Tenosynovitis of tibialis posterior tendon (Left) 08/15/2021   History of cervical dysplasia 06/05/2021   Family history of breast cancer 06/05/2021   Increased risk of breast cancer 06/05/2021   Metatarsalgia of foot (Left) 05/18/2021    Tenosynovitis of ankle (Left) 05/18/2021   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) 05/18/2021   Chronic pain syndrome 05/16/2021   Pharmacologic therapy 05/16/2021   Disorder of skeletal system 05/16/2021   Problems influencing health status 05/16/2021   Chronic foot pain (1ry area of Pain) (Left) 05/16/2021   Chronic lower extremity pain (2ry area of Pain) (Left) 05/16/2021   Chronic knee pain (3ry area of Pain) (Bilateral) (L>R) 05/16/2021   Chronic neck pain (5th area of Pain) (Midline) 05/16/2021   Cervicogenic headache 05/16/2021   Occipital headache (Bilateral) 05/16/2021   Greater occipital neuralgia (Bilateral) 05/16/2021   Chronic shoulder pain (6th area of Pain) (Bilateral) 05/16/2021   Carpal tunnel syndrome (Right) 05/16/2021   H/O degenerative disc disease 05/15/2021   Complex regional pain syndrome type 1 of left lower extremity 03/11/2021   Genital herpes 05/05/2020   Hematuria, microscopic 05/05/2020   Asthma, stable, mild intermittent 05/05/2020   Family history of ovarian cancer 04/26/2020   Family history of kidney cancer 09/20/2019   Asthma, stable, moderate persistent 06/21/2019   Vitamin D insufficiency 05/28/2019   Insomnia 05/25/2019   Low serum vitamin D 05/25/2019   Major depressive disorder, recurrent, in partial remission (HCC) 05/25/2019   Intramural and  subserous leiomyoma of uterus 02/25/2019   DDD (degenerative disc disease), lumbosacral 02/26/2018   Lumbar radiculopathy 02/26/2018   Seasonal allergic rhinitis 02/26/2018   RAD (reactive airway disease), moderate persistent, uncomplicated 02/26/2018   Asthma    Allergies    Chronic low back pain (4th area of Pain) (Bilateral) (R>L) w/o sciatica 01/30/2015   Abnormal Pap smear of cervix 08/19/2008    PCP: Dr Kevan Peers  REFERRING PROVIDER: Dr Kevan Peers  REFERRING DIAG: R breast CA with radiation/lumpectomy   THERAPY DIAG:  Scar condition and fibrosis of skin  Pain in right  arm  Stiffness of right shoulder, not elsewhere classified  Lymphedema, not elsewhere classified  Rationale for Evaluation and Treatment: Rehabilitation  ONSET DATE: 02/18/23  SUBJECTIVE:                                                                                                                                                                                           SUBJECTIVE STATEMENT: Since last seen you I had my mammogram.  I had the MRI scan.  Everything was good.  They did drain my seroma 2 small ones on the right breast.  It felt so much better afterwards. My motion was better less pain.  I could sleep on that side.  I have been wearing my compression bra most of the time.  And then I got my sleeve for my right arm about 2 weeks ago.  I am wearing it about 3 days a week.  PERTINENT HISTORY: ONCOLOGY 06/20/23 ASSESSMENT & DISCUSSION   This is a 53 y.o. 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  Ms. Emmer had recurrent R UOQ seroma which is tender and exacerbated after completing radiation. She has limited R arm ROM Rx given for PT,IB to [facility] to find local provider in [place]. She was given Rx for compression sleeve as recommended by current PT.   PLAN   No additional surgery is recommended at this time Next bilateral contrast enhanced mammogram due 01/2024 Return 01/2024 for a follow-up office visit and breast exam with same day mammogram Recommend PT with [facility] provider to improve R arm ROM and lymphedema of R breast/arm.  May use compression sleeve daily in daytime for R arm lymphedema.  Asencion Blacksmith   PATIENT GOALS:  Reassess how my recovery is going related to arm function, pain, and swelling.  PAIN:  Are you  having pain? 4/10  right breast into axilla and upper arm  PRECAUTIONS: Recent Surgery, R  UE Lymphedema risk,     ACTIVITY LEVEL / LEISURE: Used to walk in the mall.  15 year old daughter lives with her.  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   OBJECTIVE:    OBSERVATIONS: At eval Patient with rounded shoulder on the right more than the left.  Guarding her right arm carrying a close to body.  Because of pain.  Some slow deliberate motion for shoulder motion as well as elbow.  POSTURE:  Rounded shoulders.  LYMPHEDEMA ASSESSMENT:      LYMPHEDEMA/ONCOLOGY QUESTIONNAIRE - 12/09/23 0001       Right Upper Extremity Lymphedema   15 cm Proximal to Olecranon Process 28 cm    10 cm Proximal to Olecranon Process 28 cm    Olecranon Process 23 cm    15 cm Proximal to Ulnar Styloid Process 22 cm    10 cm Proximal to Ulnar Styloid Process 18.5 cm    Just Proximal to Ulnar Styloid Process 13 cm      Left Upper Extremity Lymphedema   15 cm Proximal to Olecranon Process 27.5 cm    10 cm Proximal to Olecranon Process 27.5 cm    Olecranon Process 23 cm    15 cm Proximal to Ulnar Styloid Process 22 cm    10 cm Proximal to Ulnar Styloid Process 18.3 cm    Just Proximal to Ulnar Styloid Process 14 cm             Surgery type/Date: 02/18/23 Number of lymph nodes removed: 2 Current/past treatment (radiation, hormone therapy): yes Other symptoms:  Heaviness/tightness Yes Pain Yes Pitting edema No Infections No Decreased scar mobility Yes lateal breast  Stemmer sign No PATIENT EDUCATION: Education details: Progress, anatomy and HEP  Person educated: Patient Education method: Explanation, Demonstration, Tactile cues, Verbal cues, and Handouts Education comprehension: verbalized understanding, returned demonstration, verbal cues required, and needs further education  TREATMENT: 12/09/23  Patient arrive after not being seen for  about 6 weeks.  Patient had 2 seromas drained about 2 weeks ago on R lat breast.  Patient wearing compression bra.  She arrived with her compression sleeve on right upper extremity.  Has been wearing that for about 3 days a week. Patient reports she does feel like the seroma wants to return again. Discussed with patient importance of wearing compression as well as made her new trip back to use 2 to 4 hours a day. Continue to wear the compression bra as much as she can. Measurements was taken for bilateral circumferences of upper extremity. Appear to be within normal limits.  Do recommend for patient to continue to wear right upper extremity over-the-counter compression sleeve with high risk activity for the next 2 weeks   Patient's right shoulder motion improved 145 to 150 degrees for flexion and abduction Do review with patient's pec stretches in corner.  Gentle 10 reps Prior to review with patient again pulleys for active assisted range of motion for shoulder flexion and abduction.  Patient to do 20 reps 2 times a day after pec stretch Keep it pain-free Patient to continue with ulnar nerve glide as well as composite to side.           ASSESSMENT:  CLINICAL IMPRESSION: Patient is postop right lumpectomy followed by radiation.  Patient  at eval with a hard lump over incision on the right lateral breast that appeared  to have shown in the past seroma .  But was referred to therapy with the thought of being scar tissue.  Patient was seen for a few visits.  With great success with range of motion as well as scar tissue release with less pain.  But patient was not seen for about 6 weeks.  Patient returns today with reports of 2 small seromas was drained on the right breast about 2 weeks ago with great relief in pain and tenderness as well as increased R  shoulder motion overhead.  Flexion and ABD 145 to 150 degrees -  Remind patient to continue to wear her compression bra made her a new chip bag  to wear over seroma-   To tolerance.  2 to 4 hours a day.  Also reviewed with patient her active assisted range of motion with pulleys and in standing  ext rotation range of motion.  Patient was fitted about 2 weeks ago with a over-the-counter compression sleeve on right upper extremity.  Patient measurements doing really well.  Recommend for patient to continue with home exercises for 2 weeks as well as wearing her sleeve for high risk activities.  Patient limited in functional use of right upper extremity in ADLs and IADLs.  Patient with a history of neuropathy, carpal tunnel and right upper extremity.  Has a history of complex regional in right foot.  Pt will continue to benefit from skilled therapeutic intervention to improve on the following deficits: Decreased knowledge of precautions, impaired UE functional use, pain, decreased ROM, postural dysfunction.   OT treatment/interventions: ADL/Self care home management, 97110-Therapeutic exercises, 97530- Therapeutic activity, 97535- Self Care, 09811- Manual therapy, Patient/Family education, Manual lymph drainage, Scar mobilization, and DME instructions   GOALS: Goals reviewed with patient? Yes  LONG TERM GOALS:  (STG=LTG)  GOALS Name Target Date  Goal status  1 Pt will demonstrate she has regained R  shoulder ROM and function post operatively to be independ in ADL's and IADL's - close to prior level of function  Baseline: AROM WFL - pt did had CTS and neuropathy  prior to tx  12 wks Progressing  2 Fibrosis and scar tissue with tenderness on right breast decrease to less than 2/10.  BASELINE: Patient had seroma drained 2 small ones about 2 weeks ago.  On lateral breast.  On the right 12 Progressing  3 Patient to be independent and wearing correct compression sleeve and glove as well as compression for right breast to decrease lymphatic symptoms and prevent future infection and lymphatic cording. BASELINE: Patient was fitted 2 weeks ago with a  over-the-counter compression sleeve on the right upper extremity.  Patient has been wearing it about 3 days a week.  Reviewed with patient to wear it with high risk activities. 12 Progressing          PLAN:  OT FREQUENCY/DURATION: 1x wk to biweekly depending on progress- 12 wks      Scar massage You can begin gentle scar massage to you incision sites. Gently place one hand on the incision and move the skin (without sliding on the skin) in various directions. Do this for a few minutes and then you can gently massage either coconut oil or vitamin E cream into the scars.  Compression garment You should continue wearing your compression bra until you feel like you no longer have swelling.  Home exercise Program Continue doing the exercises you were given until you feel like you can do them without feeling any tightness  at the end.   Walking Program Studies show that 30 minutes of walking per day (fast enough to elevate your heart rate) can significantly reduce the risk of a cancer recurrence. If you can't walk due to other medical reasons, we encourage you to find another activity you could do (like a stationary bike or water exercise).  Posture After breast cancer surgery, people frequently sit with rounded shoulders posture because it puts their incisions on slack and feels better. If you sit like this and scar tissue forms in that position, you can become very tight and have pain sitting or standing with good posture. Try to be aware of your posture and sit and stand up tall to heal properly.    Heloise Lobo, OTR/L,CLT 12/09/2023, 2:00 PM

## 2023-12-11 ENCOUNTER — Ambulatory Visit: Admitting: Obstetrics and Gynecology

## 2023-12-30 ENCOUNTER — Ambulatory Visit: Attending: Internal Medicine | Admitting: Occupational Therapy

## 2024-01-13 ENCOUNTER — Other Ambulatory Visit (HOSPITAL_COMMUNITY)
Admission: RE | Admit: 2024-01-13 | Discharge: 2024-01-13 | Disposition: A | Discharge status: Home / Self Care | Source: Ambulatory Visit | Attending: Obstetrics and Gynecology | Admitting: Obstetrics and Gynecology

## 2024-01-13 ENCOUNTER — Encounter: Payer: Self-pay | Admitting: Obstetrics and Gynecology

## 2024-01-13 ENCOUNTER — Ambulatory Visit (INDEPENDENT_AMBULATORY_CARE_PROVIDER_SITE_OTHER): Admitting: Obstetrics and Gynecology

## 2024-01-13 VITALS — BP 133/80 | HR 92 | Ht 65.0 in | Wt 142.0 lb

## 2024-01-13 DIAGNOSIS — Z124 Encounter for screening for malignant neoplasm of cervix: Secondary | ICD-10-CM

## 2024-01-13 DIAGNOSIS — Z1151 Encounter for screening for human papillomavirus (HPV): Secondary | ICD-10-CM | POA: Diagnosis present

## 2024-01-13 DIAGNOSIS — Z853 Personal history of malignant neoplasm of breast: Secondary | ICD-10-CM | POA: Insufficient documentation

## 2024-01-13 DIAGNOSIS — Z803 Family history of malignant neoplasm of breast: Secondary | ICD-10-CM

## 2024-01-13 DIAGNOSIS — Z1211 Encounter for screening for malignant neoplasm of colon: Secondary | ICD-10-CM

## 2024-01-13 DIAGNOSIS — Z01419 Encounter for gynecological examination (general) (routine) without abnormal findings: Secondary | ICD-10-CM | POA: Diagnosis not present

## 2024-01-13 DIAGNOSIS — A6004 Herpesviral vulvovaginitis: Secondary | ICD-10-CM

## 2024-01-13 DIAGNOSIS — Z79810 Long term (current) use of selective estrogen receptor modulators (SERMs): Secondary | ICD-10-CM | POA: Insufficient documentation

## 2024-01-13 DIAGNOSIS — Z1231 Encounter for screening mammogram for malignant neoplasm of breast: Secondary | ICD-10-CM

## 2024-01-13 MED ORDER — ACYCLOVIR 5 % EX OINT
1.0000 | TOPICAL_OINTMENT | Freq: Every day | CUTANEOUS | 0 refills | Status: AC
Start: 2024-01-13 — End: 2024-01-17

## 2024-01-13 NOTE — Patient Instructions (Signed)
 I value your feedback and you entrusting [place] with your care. If you get a [place] patient survey, I would appreciate you taking the time to let us know about your experience today. Thank you! ? ? ?

## 2024-01-13 NOTE — Progress Notes (Signed)
 PCP: Stacey Baumgarten, MD   Chief Complaint  Patient presents with   Gynecologic Exam    Knot in RB since after surgery last year. Qs on side effects on tamoxifen.    HPI:      [name] is a 53 y.o. [phone] whose LMP was No LMP recorded. (Menstrual status: Perimenopausal)., presents today for her annual examination.  Her menses are absent since stopping OCPs 4/25. Pt then started tamoxifen last yr; no menses/bleeding. No dysmen. Having significant VS sx. Can't have HRT.   Hx of leio--Ultrasound 2019 revealed SS and IM fibroids, the largest measured was 3.5 x 2.7 cm.  Pt with chronic regional pain syndrome in foot which can cause extra sweating per pt.   Sex activity: not sexually active. She does have ext vaginal dryness/sx. Using dial soap and dryer sheets. Can't have vag ERT.   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 Followed by [facility] oncology; s/p lumpectomy, radiation, and now on tamoxifen. There is a FH of breast cancer in her pat aunt and mat aunt. There is a FH of ovarian cancer in her sister and mat aunt. Also kidney cancer in her dad and PGM.  Pt is MyRisk neg 5/21 except MSH6 VUS; IBIS=15.2%/riskscore=21.4%. Pt has not had screening breast MRI.    Pt is s/p 2 palpable seromas RT axilla since surgery; s/p imaging and aspiration 4/25 and then sx returned. Has f/u in June. Has had issues with cording and having to do OT.   Colonoscopy: never, NEG cologuard 12/22; repeat after 3 yrs; pt unsure if wants colonoscopy vs cologuard.  DEXA 12/22 at [facility] with osteopenia (I can't see results)  Tobacco use: The patient denies current or previous tobacco use. Alcohol use: none  No drug  use Exercise: min active now due to foot injury  She does get adequate calcium and Vitamin D in her diet Labs with PCP.   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   Allergy    Asthma    BRCA negative 04/2020   MyRisk neg except MSH6 VUS   Carpal tunnel syndrome    Complex regional pain syndrome I    Family history of breast cancer    Family history of kidney cancer    Family history of ovarian cancer    Genital herpes    Genital warts    Hematuria    Increased risk of breast cancer 04/2020   IBIS=15.2%/riskscore=21.4%   Peripheral neuropathy 2020   Screening for colon cancer 07/2021   NEG cologuard, repeat after 3 yrs   Vitamin D deficiency     Past Surgical History:  Procedure Laterality Date   BREAST BIOPSY Right 01/21/2023   U?S BX,heart clip path pending   BREAST BIOPSY Right 01/21/2023   u/s bx, axilla COIL clip-path penmding   BREAST BIOPSY Right 01/21/2023   US  RT BREAST BX W LOC DEV 1ST LESION IMG BX SPEC US  GUIDE 01/21/2023 [facility]   CERVICAL BIOPSY  W/ LOOP ELECTRODE EXCISION  2010   LGSIL benign pathology    Family History  Problem Relation  Age of Onset   Liver cancer Mother 50   Diabetes Father    Kidney cancer Father    Sarcoidosis Sister    Ovarian cancer Sister 33       or Cervical?   Lymphoma Sister    Ovarian cancer Maternal Aunt        uknown age   Breast cancer Paternal Aunt 23   Brain cancer Paternal Aunt    Kidney cancer Paternal Grandmother    Breast cancer Cousin        27s    Social History   Socioeconomic History   Marital status: Widowed    Spouse name: Not on file   Number of children: 3   Years of education: Not on file   Highest education level: Not on file  Occupational History   Occupation: Print production planner  Tobacco Use   Smoking status: Never   Smokeless tobacco: Never  Vaping Use   Vaping status: Never Used  Substance and Sexual Activity    Alcohol use: Not Currently   Drug use: No   Sexual activity: Not Currently    Partners: Male  Other Topics Concern   Not on file  Social History Narrative   Not on file   Social Drivers of Health   Financial Resource Strain: Low Risk  (12/29/2023)   Received from [facility]   Overall Financial Resource Strain (CARDIA)    Difficulty of Paying Living Expenses: Not hard at all  Food Insecurity: No Food Insecurity (12/29/2023)   Received from [facility]   Hunger Vital Sign    Worried About Running Out of Food in the Last Year: Never true    Ran Out of Food in the Last Year: Never true  Transportation Needs: No Transportation Needs (12/29/2023)   Received from [facility] - Transportation    In the past 12 months, has lack of transportation kept you from medical appointments or from getting medications?: No    Lack of Transportation (Non-Medical): No  Physical Activity: Inactive (02/25/2019)   Exercise Vital Sign    Days of Exercise per Week: 0 days    Minutes of Exercise per Session: 0 min  Stress: Stress Concern Present (02/25/2019)   [employer] of Occupational [employer] - Occupational Stress Questionnaire    Feeling of Stress : Rather much  Social Connections: Somewhat Isolated (02/25/2019)   Social Connection and Isolation Panel [NHANES]    Frequency of Communication with Friends and Family: Twice a week    Frequency of Social Gatherings with Friends and Family: Once a week    Attends Religious Services: Never    Database administrator or Organizations: No    Attends Banker Meetings: Never    Marital Status: Living with partner  Intimate Partner Violence: Not At Risk (02/25/2019)   Humiliation, Afraid, Rape, and Kick questionnaire    Fear of Current or Ex-Partner: No    Emotionally Abused: No    Physically Abused: No    Sexually Abused: No     Current Outpatient Medications:    acetaminophen  (TYLENOL) 500 MG tablet, Take 1,000 mg by mouth., Disp: , Rfl:    acyclovir ointment (ZOVIRAX) 5 %, Apply 1 Application topically 5 (five) times daily for 4 days. Prn sx, Disp: 30 g, Rfl: 0   ALBUTEROL SULFATE HFA IN, Inhale into the lungs., Disp: , Rfl:    Calcium Carbonate Antacid 400 MG CHEW, Chew  by mouth., Disp: , Rfl:    Lidocaine  HCl 4 % SOLN, Apply 1 mL topically., Disp: , Rfl:    lidocaine -prilocaine  (EMLA ) cream, Apply 1 Application topically as needed., Disp: 30 g, Rfl: 0   QUEtiapine (SEROQUEL) 50 MG tablet, Take 50 mg by mouth at bedtime., Disp: , Rfl:    tamoxifen (NOLVADEX) 20 MG tablet, Take 20 mg by mouth daily., Disp: , Rfl:    tiZANidine (ZANAFLEX) 2 MG tablet, TAKE 1-2 TABLETS BY MOUTH THREE TIMES A DAY AS NEEDED FOR MUSCLE SPASM, Disp: , Rfl:    traMADol (ULTRAM) 50 MG tablet, Take 50 mg by mouth every 6 (six) hours as needed., Disp: , Rfl:    Vitamin D, Ergocalciferol, (DRISDOL) 1.25 MG (50000 UNIT) CAPS capsule, Take 50,000 Units by mouth once a week., Disp: , Rfl:    albuterol (PROVENTIL) (2.5 MG/3ML) 0.083% nebulizer solution, Take 3 mLs (2.5 mg total) by nebulization every 6 (six) hours as needed for Wheezing, Disp: , Rfl:    budesonide-formoterol (SYMBICORT) 160-4.5 MCG/ACT inhaler, Inhale into the lungs., Disp: , Rfl:    montelukast (SINGULAIR) 10 MG tablet, Take by mouth., Disp: , Rfl:      ROS:  Review of Systems  Constitutional:  Negative for fatigue, fever and unexpected weight change.  Respiratory:  Negative for cough, shortness of breath and wheezing.   Cardiovascular:  Negative for chest pain, palpitations and leg swelling.  Gastrointestinal:  Negative for blood in stool, constipation, diarrhea, nausea and vomiting.  Endocrine: Negative for cold intolerance, heat intolerance and polyuria.  Genitourinary:  Negative for dyspareunia, dysuria, flank pain, frequency, genital sores, hematuria, menstrual problem, pelvic pain, urgency, vaginal bleeding, vaginal  discharge and vaginal pain.  Musculoskeletal:  Negative for arthralgias, back pain, joint swelling and myalgias.  Skin:  Negative for rash.  Neurological:  Negative for dizziness, syncope, light-headedness, numbness and headaches.  Hematological:  Negative for adenopathy.  Psychiatric/Behavioral:  Negative for agitation, confusion, sleep disturbance and suicidal ideas. The patient is not nervous/anxious.    BREAST: mass/pain    Objective: BP 133/80   Pulse 92   Ht 5\' 5"  (1.651 m)   Wt 142 lb (64.4 kg)   BMI 23.63 kg/m    Physical Exam Constitutional:      Appearance: She is well-developed.  Genitourinary:     Vulva normal.     Right Labia: No rash, tenderness or lesions.    Left Labia: No tenderness, lesions or rash.    No vaginal discharge, erythema or tenderness.      Right Adnexa: not tender and no mass present.    Left Adnexa: not tender and no mass present.    No cervical friability or polyp.     Uterus is enlarged.     Uterus is not tender.  Breasts:    Right: Mass present. No nipple discharge, skin change or tenderness.     Left: No mass, nipple discharge, skin change or tenderness.  Neck:     Thyroid: No thyromegaly.  Cardiovascular:     Rate and Rhythm: Normal rate and regular rhythm.     Heart sounds: Normal heart sounds. No murmur heard. Pulmonary:     Effort: Pulmonary effort is normal.     Breath sounds: Normal breath sounds.  Chest:    Abdominal:     Palpations: Abdomen is soft.     Tenderness: There is no abdominal tenderness. There is no guarding or rebound.  Musculoskeletal:  General: Normal range of motion.     Cervical back: Normal range of motion.  Lymphadenopathy:     Cervical: No cervical adenopathy.  Neurological:     General: No focal deficit present.     Mental Status: She is alert and oriented to person, place, and time.     Cranial Nerves: No cranial nerve deficit.  Skin:    General: Skin is warm and dry.  Psychiatric:         Mood and Affect: Mood normal.        Behavior: Behavior normal.        Thought Content: Thought content normal.        Judgment: Judgment normal.  Vitals reviewed.     Assessment/Plan: Encounter for annual routine gynecological examination  Cervical cancer screening - Plan: Cytology - PAP  Screening for HPV (human papillomavirus) - Plan: Cytology - PAP  Encounter for screening mammogram for malignant neoplasm of breast; pt followed by [facility]; has appt 6/25  History of right breast cancer--on tamoxifen  Family history of breast cancer--pt is MyRisk neg, followed [facility]  Use of tamoxifen (Nolvadex)--f/u prn AUB  Screening for colon cancer--colonoscopy/cologuard discussed. Recommended colonoscopy given hx of breast cancer. Pt to consider and f/u for ref.   Herpes simplex vulvovaginitis - Plan: acyclovir ointment (ZOVIRAX) 5 %; prefers topical tx. Rx eRxd. F/u prn.    Meds ordered this encounter  Medications   acyclovir ointment (ZOVIRAX) 5 %    Sig: Apply 1 Application topically 5 (five) times daily for 4 days. Prn sx    Dispense:  30 g    Refill:  0    Supervising Provider:   ROBY, MICIA [[id number]]           GYN counsel breast self exam, mammography screening, menopause, adequate intake of calcium and vitamin D, diet and exercise    F/U  Return in about 1 year (around 01/12/2025).  Rohan Juenger B. Stacey Balash, PA-C 01/13/2024 5:12 PM

## 2024-01-19 LAB — CYTOLOGY - PAP
Adequacy: ABSENT
Comment: NEGATIVE
Diagnosis: NEGATIVE
High risk HPV: NEGATIVE

## 2024-01-20 ENCOUNTER — Ambulatory Visit: Admitting: Occupational Therapy

## 2024-01-26 ENCOUNTER — Encounter: Payer: Self-pay | Admitting: Obstetrics and Gynecology
